# Patient Record
Sex: Female | Born: 1935
Health system: Southern US, Community
[De-identification: ages and names within clinical notes are randomized; demographics above are authoritative.]

## PROBLEM LIST (undated history)

## (undated) DIAGNOSIS — I471 Supraventricular tachycardia, unspecified: Secondary | ICD-10-CM

## (undated) DIAGNOSIS — M1990 Unspecified osteoarthritis, unspecified site: Secondary | ICD-10-CM

## (undated) DIAGNOSIS — Z87442 Personal history of urinary calculi: Secondary | ICD-10-CM

## (undated) DIAGNOSIS — R0600 Dyspnea, unspecified: Secondary | ICD-10-CM

## (undated) HISTORY — PX: TONSILLECTOMY: SUR1361

---

## 1999-12-10 ENCOUNTER — Other Ambulatory Visit: Admission: RE | Admit: 1999-12-10 | Discharge: 1999-12-10 | Payer: Self-pay | Admitting: Family Medicine

## 1999-12-18 ENCOUNTER — Encounter: Payer: Self-pay | Admitting: Family Medicine

## 1999-12-18 ENCOUNTER — Ambulatory Visit (HOSPITAL_COMMUNITY): Admission: RE | Admit: 1999-12-18 | Discharge: 1999-12-18 | Payer: Self-pay | Admitting: Family Medicine

## 2001-06-10 ENCOUNTER — Other Ambulatory Visit: Admission: RE | Admit: 2001-06-10 | Discharge: 2001-06-10 | Payer: Self-pay | Admitting: Family Medicine

## 2001-06-26 ENCOUNTER — Encounter: Payer: Self-pay | Admitting: Family Medicine

## 2001-06-26 ENCOUNTER — Encounter: Admission: RE | Admit: 2001-06-26 | Discharge: 2001-06-26 | Payer: Self-pay | Admitting: Family Medicine

## 2003-12-30 ENCOUNTER — Other Ambulatory Visit: Admission: RE | Admit: 2003-12-30 | Discharge: 2003-12-30 | Payer: Self-pay | Admitting: Family Medicine

## 2004-01-10 ENCOUNTER — Encounter: Admission: RE | Admit: 2004-01-10 | Discharge: 2004-01-10 | Payer: Self-pay | Admitting: Family Medicine

## 2007-02-10 ENCOUNTER — Encounter: Admission: RE | Admit: 2007-02-10 | Discharge: 2007-02-10 | Payer: Self-pay | Admitting: Family Medicine

## 2007-06-08 ENCOUNTER — Emergency Department (HOSPITAL_COMMUNITY): Admission: EM | Admit: 2007-06-08 | Discharge: 2007-06-08 | Payer: Self-pay | Admitting: Emergency Medicine

## 2010-09-04 NOTE — Consult Note (Signed)
NAMEMarland Kitchen  JOELEE, SNOKE NO.:  [id number]   MEDICAL RECORD NO.:  [id number]          PATIENT TYPE:  EMS   LOCATION:  ED                           FACILITY:  [facility]   PHYSICIAN:  Madelynn Done, MD  DATE OF BIRTH:  07/10/1931   DATE OF CONSULTATION:  06/08/2007  DATE OF DISCHARGE:  06/08/2007                                 CONSULTATION   REASON FOR CONSULTATION:  Right wrist injury and right knee injury.   HISTORY OF PRESENT ILLNESS:  Mrs. Nancy Conrad is a healthy-appearing, right-  hand dominant 77 year old female who fell getting out of a mobile home.  The patient sustained a fall onto her right knee and right wrist.  She  presented to the emergency department with pain and deformity of the  right wrist as well as pain and swelling over the right knee.  The  patient was consulted for the management of her wrist fracture and knee  injury.  The patient denies any complaints with the left upper  extremity.  He denies loss of consciousness, syncope and chest pain,  shortness of breath.   PAST MEDICAL HISTORY:  1. Osteoporosis.  2. Hypercholesterolemia.   PAST SURGICAL HISTORY:  No orthopedic surgeries.   ALLERGIES:  CODEINE.   MEDICATIONS:  Simvastatin and Fosamax.   SOCIAL HISTORY:  She lives with her husband.  She is a nonsmoker,  nondrinker.   REVIEW OF SYSTEMS:  No recent illnesses or hospitalizations.   PHYSICAL EXAMINATION:  GENERAL APPEARANCE:  She is a healthy-appearing  white female.  She is laying in bed comfortably.  She does not appear to  be in any acute distress.  VITAL SIGNS:  Temperature of 99.1, blood pressure 149/98, heart rate of  88, respirations 18.   On examination of the right upper extremity, she does not have any open  wounds or scars.  She has an obvious deformity to her right wrist.  She  is able to extend her thumb from a palm flat position.  She is able to  make the A-okay sign, cross her fingers, abduct and adduct her digits.  Her fingertips are warm and well-perfused.  She has a good radial pulse  with good sensation distally.  She does not have pain with flexion and  extension of her elbow but has limited wrist and forearm rotation.  Her  compartments are soft.   On examination of the right lower extremity.  She is able to dorsiflex  and plantar flex her toes and ankles.  She has an abrasion over the  anterior surface of the knee.  She is able to perform a straight leg  raise.  Her compartments are soft.   RADIOGRAPHY:  Two views of the right knee did show a nondisplaced  transverse fracture of the patella.   Two views of the wrist show the comminuted intra-articular fracture of  the right distal radius with an associated distal ulna fracture.  She  appears to have an old ulnar styloid fracture.   IMPRESSION:  1. Right intra-articular distal radius fracture and distal ulna  fracture.  2. Right nondisplaced patella fracture, right knee.  3. Osteoporosis.   PLAN:  Today, after the films were reviewed with the patient, we elected  to proceed with a closed manipulation of her right distal radius  fracture.  The patient underwent a hematoma block with 1% lidocaine 10  mL. Under fingertraps and 5 pounds weight, the patient underwent closed  manipulation.  The patient was then placed in a sugar-tong splint.  She  tolerated the procedure well.  Post reduction radiographs do show  improvement in alignment and she does have the dorsal comminution but  there is improvement in her radial height and length as well as a  neutral tilt on the lateral.   The patient is going to be discharged to home.  She is to weightbear as  tolerated in the knee immobilizer keeping it on at all times.  She is  going to keep her hand elevated.  Sling was given for when she is  mobile.  She is going to follow up in the office in one week.  Will  likely repeat her radiographs at that point in the splint if her splint  is in good  condition.  The patient was explained she may require  surgical intervention for her wrist depending on the displacement noted.  We talked about watching the knee closely but with the extensor  mechanism in continuity try to treat the patella flap fracture in a  closed manner.  All questions were addressed today.  Discussed plan with  the family.  She was going to be written for oral pain medication, ice,  elevation.  All questions were addressed prior to discharge.      Madelynn Done, MD  Electronically Signed     FWO/MEDQ  D:  06/08/2007  T:  06/09/2007  Job:  [id number]

## 2011-11-06 ENCOUNTER — Other Ambulatory Visit: Payer: Self-pay | Admitting: Orthopedic Surgery

## 2011-11-06 DIAGNOSIS — M79671 Pain in right foot: Secondary | ICD-10-CM

## 2011-11-07 ENCOUNTER — Ambulatory Visit
Admission: RE | Admit: 2011-11-07 | Discharge: 2011-11-07 | Disposition: A | Discharge status: Home / Self Care | Payer: Medicare Other | Source: Ambulatory Visit | Attending: Orthopedic Surgery | Admitting: Orthopedic Surgery

## 2011-11-07 DIAGNOSIS — M79671 Pain in right foot: Secondary | ICD-10-CM

## 2012-07-29 ENCOUNTER — Emergency Department (HOSPITAL_COMMUNITY)
Admission: EM | Admit: 2012-07-29 | Discharge: 2012-07-29 | Disposition: A | Discharge status: Home / Self Care | Payer: Medicare Other | Attending: Emergency Medicine | Admitting: Emergency Medicine

## 2012-07-29 ENCOUNTER — Encounter (HOSPITAL_COMMUNITY): Payer: Self-pay | Admitting: *Deleted

## 2012-07-29 ENCOUNTER — Emergency Department (HOSPITAL_COMMUNITY): Payer: Medicare Other

## 2012-07-29 DIAGNOSIS — Z8739 Personal history of other diseases of the musculoskeletal system and connective tissue: Secondary | ICD-10-CM | POA: Insufficient documentation

## 2012-07-29 DIAGNOSIS — I471 Supraventricular tachycardia: Secondary | ICD-10-CM

## 2012-07-29 DIAGNOSIS — R5381 Other malaise: Secondary | ICD-10-CM | POA: Insufficient documentation

## 2012-07-29 DIAGNOSIS — I498 Other specified cardiac arrhythmias: Secondary | ICD-10-CM | POA: Insufficient documentation

## 2012-07-29 DIAGNOSIS — R002 Palpitations: Secondary | ICD-10-CM | POA: Insufficient documentation

## 2012-07-29 HISTORY — DX: Unspecified osteoarthritis, unspecified site: M19.90

## 2012-07-29 LAB — CBC WITH DIFFERENTIAL/PLATELET
Basophils Absolute: 0 10*3/uL (ref 0.0–0.1)
Basophils Relative: 0 % (ref 0–1)
Eosinophils Absolute: 0 10*3/uL (ref 0.0–0.7)
Eosinophils Relative: 1 % (ref 0–5)
MCH: 31.1 pg (ref 26.0–34.0)
MCHC: 34.1 g/dL (ref 30.0–36.0)
MCV: 91.3 fL (ref 78.0–100.0)
Monocytes Absolute: 1.2 10*3/uL — ABNORMAL HIGH (ref 0.1–1.0)
Platelets: 177 10*3/uL (ref 150–400)
RDW: 13.3 % (ref 11.5–15.5)
WBC: 8.5 10*3/uL (ref 4.0–10.5)

## 2012-07-29 LAB — POCT I-STAT, CHEM 8
Calcium, Ion: 1.15 mmol/L (ref 1.13–1.30)
Hemoglobin: 13.9 g/dL (ref 12.0–15.0)
Sodium: 139 mEq/L (ref 135–145)
TCO2: 27 mmol/L (ref 0–100)

## 2012-07-29 LAB — POCT I-STAT TROPONIN I: Troponin i, poc: 0.12 ng/mL (ref 0.00–0.08)

## 2012-07-29 MED ORDER — ASPIRIN 81 MG PO CHEW
324.0000 mg | CHEWABLE_TABLET | Freq: Once | ORAL | Status: AC
  Administered 2012-07-29: 324 mg via ORAL
  Filled 2012-07-29: qty 4

## 2012-07-29 MED ORDER — METOPROLOL TARTRATE 50 MG PO TABS: 25.0000 mg | ORAL_TABLET | Freq: Two times a day (BID) | ORAL | Status: DC

## 2012-07-29 NOTE — ED Notes (Signed)
Admit date: 07/29/2012 Referring Physician    Primary Cardiologist  Eldridge Dace Reason for Consultation  supraventricular tachycardia  HPI: 79 year old woman who has had palpitations intermittently for many years.  Her longest episode in the past would last up to a couple of hours.  They always occurred at night.  They would wake her from sleep.  She never had lightheadedness or syncope.  She would just continue to lie down and the palpitations would eventually resolve.  She has tried coughing and Valsalva maneuvers with some success.  Early this morning, she began having her typical symptoms.  They lasted 10 hours prior to her calling EMS.  This was the longest episode that she has had.  She was brought to the emergency room and given IV been seen.  Her heart rhythm converted to normal sinus rhythm.  She currently feels well.  She denies any chest pain or shortness of breath.  She does walk frequently and has not had any symptoms of chest discomfort or shortness of breath.  She has never had any type of significant cardiac evaluation in the past.     PMH:   Past Medical History  Diagnosis Date  . Arthritis      PSH:  No past surgical history on file.  Allergies:  Review of patient's allergies indicates no known allergies. Prior to Admit Meds:   (Not in a hospital admission) Fam HX:   No family history on file. Social HX:    History   Social History  . Marital Status: Married    Spouse Name: N/A    Number of Children: N/A  . Years of Education: N/A   Occupational History  . Not on file.   Social History Main Topics  . Smoking status: Never Smoker   . Smokeless tobacco: Not on file  . Alcohol Use: Not on file  . Drug Use: No  . Sexually Active: Not on file   Other Topics Concern  . Not on file   Social History Narrative  . No narrative on file     ROS:  All 11 ROS were addressed and are negative except what is stated in the HPI  Physical Exam: Blood pressure 107/68, pulse  77, temperature 98.3 F (36.8 C), temperature source Oral, resp. rate 19, SpO2 97.00%.  General: Well developed, well nourished, in no acute distress Head:    Normal cephalic and atramatic  Lungs:  Clear bilaterally to auscultation and percussion. Heart:   HRRR S1 S2             No carotid bruit. No JVD.   Abdomen:  abdomen soft and non-tender Msk:   Normal strength and tone for age. Extremities:   No clubbing, cyanosis or edema.  DP +1 Neuro: Alert and oriented X 3. Psych:  Normal affect, responds appropriately    Labs:   Lab Results  Component Value Date   WBC 8.5 07/29/2012   HGB 13.9 07/29/2012   HCT 41.0 07/29/2012   MCV 91.3 07/29/2012   PLT 177 07/29/2012    Recent Labs Lab 07/29/12 1157  NA 139  K 4.8  CL 104  BUN 18  CREATININE 0.80  GLUCOSE 112*   No results found for this basename: PTT   No results found for this basename: INR, PROTIME   No results found for this basename: CKTOTAL, CKMB, CKMBINDEX, TROPONINI     No results found for this basename: CHOL   No results found for this basename: HDL  No results found for this basename: LDLCALC   No results found for this basename: TRIG   No results found for this basename: CHOLHDL   No results found for this basename: LDLDIRECT      Radiology:  Dg Chest 2 View  07/29/2012  *RADIOLOGY REPORT*  Clinical Data: Difficulty breathing.  CHEST - 2 VIEW  Comparison: Thoracic spine series 09/18/2010 and chest radiograph 07/28/2006.  Findings: Trachea is midline.  Heart size normal.  Descending thoracic aorta is tortuous.  Curvilinear soft tissue in the right paratracheal region appears new from prior examinations.  Lungs are mildly hyperinflated. Probable pleural parenchymal scarring at the left costophrenic angle.  Multiple thoracic spine compression fractures appear grossly unchanged.  IMPRESSION:  Curvilinear soft tissue density in the low right paratracheal region appears new from prior examinations. Non emergent CT chest  with contrast could be performed in further evaluation, as clinically indicated.   Original Report Authenticated By: Leanna Battles, M.D.     EKG:  Likely sinus rhythm.  Unusual P-wave axis.  No significant ST segment changes.  On telemetry, she has evidence of PACs.  ASSESSMENT: SVT  PLAN:  We'll plan for outpatient echocardiogram.  Minimally elevated troponin likely due to prolonged fast heart rate.  She has no anginal symptoms.  She has been hemodynamically stable.  We'll consider outpatient stress testing.  Start metoprolol 25 mg by mouth twice a day to help suppress the SVT.  Our office will contact her for followup.  Of note, she has been under a lot of stress recently with her husband's illness.  He is currently admitted to the hospital.  Corky Crafts., MD  07/29/2012  3:32 PM

## 2012-07-29 NOTE — ED Provider Notes (Signed)
History     CSN: [id number]  Arrival date & time 07/29/12  1038   First MD Initiated Contact with Patient 07/29/12 1040      Chief Complaint  Patient presents with  . Tachycardia    (Consider location/radiation/quality/duration/timing/severity/associated sxs/prior treatment) HPI Comments: 79 year old female with no significant past medical history who presents from home with a complaint of palpitations. The patient states that she had onset of palpitations approximately midnight, 10 hours ago, this was persistent throughout the evening and associated with diaphoresis, generalized fatigue and weakness. She has had these episodes in the past the last couple of hours and then resolves spontaneously but she has never sought medical attention. This episode lasted greater than 10 hours prompting her call to 911, she was given 6 mg of adenosine by paramedics which successfully converted the rhythm, interventricular tachycardia at the rate of 210 beats per minute to a normal sinus rhythm. The patient states that at this time she has no complaints other than feeling fatigued however she has been sleeping poorly and has been in the hospital with her husband who has been here for the last 2 days. She has never seen a cardiologist, has never been treated for hypertension diabetes and is a nonsmoker. She denies fevers chills nausea vomiting coughing swelling rashes diarrhea dysuria or any other complaints  The history is provided by the patient and the EMS personnel.    Past Medical History  Diagnosis Date  . Arthritis     No past surgical history on file.  No family history on file.  History  Substance Use Topics  . Smoking status: Never Smoker   . Smokeless tobacco: Not on file  . Alcohol Use: Not on file    OB History   Grav Para Term Preterm Abortions TAB SAB Ect Mult Living                  Review of Systems  All other systems reviewed and are negative.    Allergies  Review of  patient's allergies indicates no known allergies.  Home Medications   Current Outpatient Rx  Name  Route  Sig  Dispense  Refill  . acetaminophen (TYLENOL) 500 MG tablet   Oral   Take 500-1,000 mg by mouth every 6 (six) hours as needed for pain.         . calcitonin, salmon, (MIACALCIN/FORTICAL) 200 UNIT/ACT nasal spray   Nasal   Place 1 spray into the nose daily.         . metoprolol (LOPRESSOR) 50 MG tablet   Oral   Take 0.5 tablets (25 mg total) by mouth 2 (two) times daily.   60 tablet   1     BP 106/64  Pulse 77  Temp(Src) 98.4 F (36.9 C) (Oral)  Resp 17  SpO2 97%  Physical Exam  Nursing note and vitals reviewed. Constitutional: She appears well-developed and well-nourished. No distress.  HENT:  Head: Normocephalic and atraumatic.  Mouth/Throat: Oropharynx is clear and moist. No oropharyngeal exudate.  Eyes: Conjunctivae and EOM are normal. Pupils are equal, round, and reactive to light. Right eye exhibits no discharge. Left eye exhibits no discharge. No scleral icterus.  Neck: Normal range of motion. Neck supple. No JVD present. No thyromegaly present.  Cardiovascular: Normal rate, regular rhythm, normal heart sounds and intact distal pulses.  Exam reveals no gallop and no friction rub.   No murmur heard. Pulmonary/Chest: Effort normal and breath sounds normal. No respiratory distress. She  has no wheezes. She has no rales.  Abdominal: Soft. Bowel sounds are normal. She exhibits no distension and no mass. There is no tenderness.  Musculoskeletal: Normal range of motion. She exhibits no edema and no tenderness.  Lymphadenopathy:    She has no cervical adenopathy.  Neurological: She is alert. Coordination normal.  Skin: Skin is warm and dry. No rash noted. No erythema.  Psychiatric: She has a normal mood and affect. Her behavior is normal.    ED Course  Procedures (including critical care time)  Labs Reviewed  CBC WITH DIFFERENTIAL - Abnormal; Notable for  the following:    Neutrophils Relative 78 (*)    Lymphocytes Relative 7 (*)    Lymphs Abs 0.6 (*)    Monocytes Relative 14 (*)    Monocytes Absolute 1.2 (*)    All other components within normal limits  POCT I-STAT, CHEM 8 - Abnormal; Notable for the following:    Glucose, Bld 112 (*)    All other components within normal limits  POCT I-STAT TROPONIN I - Abnormal; Notable for the following:    Troponin i, poc 0.12 (*)    All other components within normal limits   Dg Chest 2 View  07/29/2012  *RADIOLOGY REPORT*  Clinical Data: Difficulty breathing.  CHEST - 2 VIEW  Comparison: Thoracic spine series 09/18/2010 and chest radiograph 07/28/2006.  Findings: Trachea is midline.  Heart size normal.  Descending thoracic aorta is tortuous.  Curvilinear soft tissue in the right paratracheal region appears new from prior examinations.  Lungs are mildly hyperinflated. Probable pleural parenchymal scarring at the left costophrenic angle.  Multiple thoracic spine compression fractures appear grossly unchanged.  IMPRESSION:  Curvilinear soft tissue density in the low right paratracheal region appears new from prior examinations. Non emergent CT chest with contrast could be performed in further evaluation, as clinically indicated.   Original Report Authenticated By: Leanna Battles, M.D.      1. SVT (supraventricular tachycardia)       MDM  At this time the patient appears benign, has a normal exam including heart sounds lung sounds abdominal exam without any peripheral edema or JVD. I suspect that she's been having intermittent episodes of SVT though this could have also been paroxysmal atrial fibrillation or ventricular arrhythmias in the past. Will discuss with cardiology after workup completed. Cardiac monitor, EKG, labs, chest x-ray  ED ECG REPORT  I personally interpreted this EKG   Date: 07/29/2012   Rate: 84  Rhythm: normal sinus rhythm  QRS Axis: normal  Intervals: normal  ST/T Wave  abnormalities: normal  Conduction Disutrbances:none  Narrative Interpretation:   Old EKG Reviewed: none available  Requested cardiology consultation in the emergency department, the patient does have a low elevated troponin though this is borderline and likely related to the amount of time that she was in SVT. Aspirin has been given, patient remained stable  D/w Dr. Eldridge Dace will se pt.  Likely Metoprolol 25mg  Po BID and f/u for echo, possible cath per Cardiologist.  Pt has been stable since arrival, no recurrent arrhythmias.  Cardiology has seen patient, agrees with discharge and followup for echocardiogram, patient informed, stable for discharge       Vida Roller, MD 07/29/12 [phone]

## 2012-07-29 NOTE — ED Notes (Signed)
Pt in via [facility] from home, pt c/o rapid heart rate & dizziness onset last night, pt c/o worsening today @ 10:00, pt received 6 mg Adenisone in route with initial HR of 210, pt converted to NSR with HR in mid 80s, pt A&O x4, follows commands, speaks in complete sentences, pt denies CP, SOB, pt denies current N/V/D

## 2013-02-11 ENCOUNTER — Ambulatory Visit (INDEPENDENT_AMBULATORY_CARE_PROVIDER_SITE_OTHER): Payer: Medicare Other | Admitting: Interventional Cardiology

## 2013-02-11 ENCOUNTER — Encounter: Payer: Self-pay | Admitting: Interventional Cardiology

## 2013-02-11 VITALS — BP 122/90 | HR 73 | Ht 64.0 in | Wt 143.8 lb

## 2013-02-11 DIAGNOSIS — I498 Other specified cardiac arrhythmias: Secondary | ICD-10-CM

## 2013-02-11 DIAGNOSIS — I471 Supraventricular tachycardia: Secondary | ICD-10-CM

## 2013-02-11 NOTE — Patient Instructions (Signed)
Your physician wants you to follow-up in: 1 year with Dr. Varanasi. You will receive a reminder letter in the mail two months in advance. If you don't receive a letter, please call our office to schedule the follow-up appointment.  Your physician recommends that you continue on your current medications as directed. Please refer to the Current Medication list given to you today.  

## 2013-02-11 NOTE — Progress Notes (Signed)
Patient ID: Nancy Conrad, female   DOB: [birth year], 79 y.o.   MRN: [id number]    [address] [place], [place]  [address] Phone: [phone] Fax:  [phone]  Date:  02/11/2013   ID:  Nancy Conrad, DOB 1931/05/12, MRN [id number]  PCP:  Nancy Aschoff, MD      History of Present Illness: Nancy Conrad is a 79 y.o. female who had SVT lasting about 10 hours in early 2014. She went to the emergency room. She received adenosine and she converted to normal sinus rhythm. She was started on a beta blocker. She has not had any further episodes since then. Her husband was ill and he passed away a few months ago. She seems to be getting used to things. She continues to exercise on a regular basis including walking and swimming. Overall, she has felt well.    Wt Readings from Last 3 Encounters:  02/11/13 143 lb 12.8 oz (65.227 kg)     Past Medical History  Diagnosis Date  . Arthritis     Current Outpatient Prescriptions  Medication Sig Dispense Refill  . acetaminophen (TYLENOL) 500 MG tablet Take 500-1,000 mg by mouth every 6 (six) hours as needed for pain.      . B Complex Vitamins (VITAMIN B COMPLEX PO) Take 100 mg by mouth daily.      . calcitonin, salmon, (MIACALCIN/FORTICAL) 200 UNIT/ACT nasal spray Place 1 spray into the nose daily.      . Calcium 200 MG TABS Take 1 tablet by mouth.      . cholecalciferol (VITAMIN D) 1000 UNITS tablet Take 2,000 Units by mouth daily.      . metoprolol (LOPRESSOR) 50 MG tablet Take 0.5 tablets (25 mg total) by mouth 2 (two) times daily.  60 tablet  1   No current facility-administered medications for this visit.    Allergies:   No Known Allergies  Social History:  The patient  reports that she has never smoked. She does not have any smokeless tobacco history on file. She reports that she does not use illicit drugs.   Family History:  The patient's family history is not on file.   ROS:  Please see the history of present illness.   No nausea, vomiting.  No fevers, chills.  No focal weakness.  No dysuria.   All other systems reviewed and negative.   PHYSICAL EXAM: VS:  BP 122/90  Pulse 73  Ht 5\' 4"  (1.626 m)  Wt 143 lb 12.8 oz (65.227 kg)  BMI 24.67 kg/m2  SpO2 97% Well nourished, well developed, in no acute distress HEENT: normal Neck: no JVD, no carotid bruits Cardiac:  normal S1, S2; RRR;  Lungs:  clear to auscultation bilaterally, no wheezing, rhonchi or rales Abd: soft, nontender, no hepatomegaly Ext: no edema Skin: warm and dry Neuro:   no focal abnormalities noted  EKG:      ASSESSMENT AND PLAN:  SVT:   SVT/PAT  Continue Metoprolol Tartrate Tablet, 25 MG, 1 tablet, Orally, Twice a day LAB: T4 and TSH (132440) Notes: Echo showed normal left ventricular function. Check thyroid function tests. SVT sx under control. If she fails medical therapy, would have to consider referral to EP and ablation.    Procedures  Prior T4 was normal.        Labs    Lab: T4 and TSH (102725)  Thyroxine (T4) 8.9  4.5-12.0 - UG/DL   Nancy Conrad,JAY 36/64/4034 03:35:09 PM > normal  T4      Signed, Fredric Mare, MD, [facility] 02/11/2013 11:29 AM

## 2013-07-09 ENCOUNTER — Other Ambulatory Visit: Payer: Self-pay | Admitting: Interventional Cardiology

## 2013-07-22 ENCOUNTER — Encounter: Payer: Self-pay | Admitting: Interventional Cardiology

## 2017-01-13 ENCOUNTER — Ambulatory Visit
Admission: RE | Admit: 2017-01-13 | Discharge: 2017-01-13 | Disposition: A | Discharge status: Home / Self Care | Payer: Medicare Other | Source: Ambulatory Visit | Attending: Family Medicine | Admitting: Family Medicine

## 2017-01-13 ENCOUNTER — Other Ambulatory Visit: Payer: Self-pay | Admitting: Family Medicine

## 2017-01-13 DIAGNOSIS — R52 Pain, unspecified: Secondary | ICD-10-CM

## 2019-08-30 ENCOUNTER — Other Ambulatory Visit: Payer: Self-pay | Admitting: Family Medicine

## 2019-08-30 DIAGNOSIS — M810 Age-related osteoporosis without current pathological fracture: Secondary | ICD-10-CM

## 2019-11-10 ENCOUNTER — Ambulatory Visit
Admission: RE | Admit: 2019-11-10 | Discharge: 2019-11-10 | Disposition: A | Discharge status: Home / Self Care | Payer: Medicare Other | Source: Ambulatory Visit | Attending: Family Medicine | Admitting: Family Medicine

## 2019-11-10 ENCOUNTER — Other Ambulatory Visit: Payer: Self-pay

## 2019-11-10 DIAGNOSIS — M810 Age-related osteoporosis without current pathological fracture: Secondary | ICD-10-CM

## 2019-11-10 DIAGNOSIS — M85852 Other specified disorders of bone density and structure, left thigh: Secondary | ICD-10-CM | POA: Diagnosis not present

## 2019-11-12 DIAGNOSIS — M1612 Unilateral primary osteoarthritis, left hip: Secondary | ICD-10-CM | POA: Diagnosis not present

## 2019-12-13 DIAGNOSIS — E559 Vitamin D deficiency, unspecified: Secondary | ICD-10-CM | POA: Diagnosis not present

## 2019-12-13 DIAGNOSIS — M169 Osteoarthritis of hip, unspecified: Secondary | ICD-10-CM | POA: Diagnosis not present

## 2019-12-13 DIAGNOSIS — M810 Age-related osteoporosis without current pathological fracture: Secondary | ICD-10-CM | POA: Diagnosis not present

## 2019-12-16 ENCOUNTER — Encounter (HOSPITAL_COMMUNITY): Admission: RE | Admit: 2019-12-16 | Payer: Medicare PPO | Source: Ambulatory Visit

## 2019-12-22 ENCOUNTER — Ambulatory Visit (HOSPITAL_COMMUNITY): Admission: RE | Admit: 2019-12-22 | Payer: Medicare PPO | Source: Home / Self Care | Admitting: Orthopedic Surgery

## 2019-12-22 ENCOUNTER — Encounter (HOSPITAL_COMMUNITY): Admission: RE | Payer: Self-pay | Source: Home / Self Care

## 2019-12-22 DIAGNOSIS — M1612 Unilateral primary osteoarthritis, left hip: Secondary | ICD-10-CM | POA: Diagnosis not present

## 2019-12-22 SURGERY — ARTHROPLASTY, HIP, TOTAL, ANTERIOR APPROACH
Anesthesia: Choice | Site: Hip | Laterality: Left

## 2020-03-06 NOTE — Progress Notes (Addendum)
COVID Vaccine Completed: x3 Date COVID Vaccine completed:  04-26-19& 05-24-19 02-29-20 Booster COVID vaccine manufacturer: Pfizer    Moderna   [employer]   PCP - C. Duane Lope, MD Cardiologist - Everette Rank, MD.  Last OV 02-11-13  Chest x-ray -  EKG - 03-09-20 in Epic Stress Test -  ECHO - 08-06-2012 in Epic Cardiac Cath -  Pacemaker/ICD device last checked:  Sleep Study -  CPAP -   Fasting Blood Sugar -  Checks Blood Sugar _____ times a day  Blood Thinner Instructions: Aspirin Instructions: Last Dose:  Anesthesia review: SVT on Metoprolol  Patient has shortness of breath with exertion.  No fever, cough and chest pain at PAT appointment.  Able to slowly climb a flight of stairs.  Able to perform ADLs without assistance.   Patient verbalized understanding of instructions that were given to them at the PAT appointment. Patient was also instructed that they will need to review over the PAT instructions again at home before surgery.

## 2020-03-06 NOTE — Patient Instructions (Addendum)
DUE TO COVID-19 ONLY ONE VISITOR IS ALLOWED TO COME WITH YOU AND STAY IN THE WAITING ROOM ONLY DURING PRE OP AND PROCEDURE.   IF YOU WILL BE ADMITTED INTO THE HOSPITAL YOU ARE ALLOWED ONE SUPPORT PERSON DURING VISITATION HOURS ONLY (10AM -8PM)   . The support person may change daily. . The support person must pass our screening, gel in and out, and wear a mask at all times, including in the patient's room. . Patients must also wear a mask when staff or their support person are in the room.   COVID SWAB TESTING MUST BE COMPLETED ON:   Saturday, 03-11-20 @ 10:05 AM   [address] [place], [place] [address]  (Must self quarantine after testing. Follow instructions on handout.)        Your procedure is scheduled on:  Wednesday, 03-15-20   Report to [facility] Main  Entrance   Report to Short Stay at 5:30 AM   [facility])    Call this number if you have problems the morning of surgery [phone]   Do not eat food :After Midnight.   May have liquids until 4:30 AM  day of surgery  CLEAR LIQUID DIET  Foods Allowed                                                                     Foods Excluded  Water, Black Coffee and tea, regular and decaf              liquids that you cannot  Plain Jell-O in any flavor  (No red)                                     see through such as: Fruit ices (not with fruit pulp)                                      milk, soups, orange juice              Iced Popsicles (No red)                                      All solid food                                   Apple juices Sports drinks like Gatorade (No red) Lightly seasoned clear broth or consume(fat free) Sugar, honey syrup    Complete one Ensure drink the morning of surgery at  4:30 AM the day of surgery.    Oral Hygiene is also important to reduce your risk of infection.                                    Remember - BRUSH YOUR TEETH THE MORNING OF SURGERY WITH YOUR REGULAR  TOOTHPASTE   Do NOT smoke after  Midnight   Take these medicines the morning of surgery with A SIP OF WATER:  Metoprolol                               You may not have any metal on your body including hair pins, jewelry, and body piercings             Do not wear make-up, lotions, powders, perfumes/cologne, or deodorant             Do not wear nail polish.  Do not shave  48 hours prior to surgery.    Do not bring valuables to the hospital. [facility] IS NOT RESPONSIBLE   FOR VALUABLES.   Contacts, dentures or bridgework may not be worn into surgery.   Bring small overnight bag day of surgery.    Special Instructions: Bring a copy of your healthcare power of attorney and living will documents         the day of surgery if you haven't scanned them in before.              Please read over the following fact sheets you were given: IF YOU HAVE QUESTIONS ABOUT YOUR PRE OP INSTRUCTIONS PLEASE CALL [phone]   [place] - Preparing for Surgery Before surgery, you can play an important role.  Because skin is not sterile, your skin needs to be as free of germs as possible.  You can reduce the number of germs on your skin by washing with CHG (chlorahexidine gluconate) soap before surgery.  CHG is an antiseptic cleaner which kills germs and bonds with the skin to continue killing germs even after washing. Please DO NOT use if you have an allergy to CHG or antibacterial soaps.  If your skin becomes reddened/irritated stop using the CHG and inform your nurse when you arrive at Short Stay. Do not shave (including legs and underarms) for at least 48 hours prior to the first CHG shower.  You may shave your face/neck.  Please follow these instructions carefully:  1.  Shower with CHG Soap the night before surgery and the  morning of surgery.  2.  If you choose to wash your hair, wash your hair first as usual with your normal  shampoo.  3.  After you shampoo, rinse your hair and body thoroughly to  remove the shampoo.                             4.  Use CHG as you would any other liquid soap.  You can apply chg directly to the skin and wash.  Gently with a scrungie or clean washcloth.  5.  Apply the CHG Soap to your body ONLY FROM THE NECK DOWN.   Do   not use on face/ open                           Wound or open sores. Avoid contact with eyes, ears mouth and   genitals (private parts).                       Wash face,  Genitals (private parts) with your normal soap.             6.  Wash thoroughly, paying special attention to the area where your  surgery  will be performed.  7.  Thoroughly rinse your body with warm water from the neck down.  8.  DO NOT shower/wash with your normal soap after using and rinsing off the CHG Soap.                9.  Pat yourself dry with a clean towel.            10.  Wear clean pajamas.            11.  Place clean sheets on your bed the night of your first shower and do not  sleep with pets. Day of Surgery : Do not apply any lotions/deodorants the morning of surgery.  Please wear clean clothes to the hospital/surgery center.  FAILURE TO FOLLOW THESE INSTRUCTIONS MAY RESULT IN THE CANCELLATION OF YOUR SURGERY  PATIENT SIGNATURE_________________________________  NURSE SIGNATURE__________________________________  ________________________________________________________________________   Adam Phenix  An incentive spirometer is a tool that can help keep your lungs clear and active. This tool measures how well you are filling your lungs with each breath. Taking long deep breaths may help reverse or decrease the chance of developing breathing (pulmonary) problems (especially infection) following:  A long period of time when you are unable to move or be active. BEFORE THE PROCEDURE   If the spirometer includes an indicator to show your best effort, your nurse or respiratory therapist will set it to a desired goal.  If possible, sit up straight  or lean slightly forward. Try not to slouch.  Hold the incentive spirometer in an upright position. INSTRUCTIONS FOR USE  1. Sit on the edge of your bed if possible, or sit up as far as you can in bed or on a chair. 2. Hold the incentive spirometer in an upright position. 3. Breathe out normally. 4. Place the mouthpiece in your mouth and seal your lips tightly around it. 5. Breathe in slowly and as deeply as possible, raising the piston or the ball toward the top of the column. 6. Hold your breath for 3-5 seconds or for as long as possible. Allow the piston or ball to fall to the bottom of the column. 7. Remove the mouthpiece from your mouth and breathe out normally. 8. Rest for a few seconds and repeat Steps 1 through 7 at least 10 times every 1-2 hours when you are awake. Take your time and take a few normal breaths between deep breaths. 9. The spirometer may include an indicator to show your best effort. Use the indicator as a goal to work toward during each repetition. 10. After each set of 10 deep breaths, practice coughing to be sure your lungs are clear. If you have an incision (the cut made at the time of surgery), support your incision when coughing by placing a pillow or rolled up towels firmly against it. Once you are able to get out of bed, walk around indoors and cough well. You may stop using the incentive spirometer when instructed by your caregiver.  RISKS AND COMPLICATIONS  Take your time so you do not get dizzy or light-headed.  If you are in pain, you may need to take or ask for pain medication before doing incentive spirometry. It is harder to take a deep breath if you are having pain. AFTER USE  Rest and breathe slowly and easily.  It can be helpful to keep track of a log of your progress. Your caregiver can provide you with a simple table to help  with this. If you are using the spirometer at home, follow these instructions: [place] IF:   You are having  difficultly using the spirometer.  You have trouble using the spirometer as often as instructed.  Your pain medication is not giving enough relief while using the spirometer.  You develop fever of 100.5 F (38.1 C) or higher. SEEK IMMEDIATE MEDICAL CARE IF:   You cough up bloody sputum that had not been present before.  You develop fever of 102 F (38.9 C) or greater.  You develop worsening pain at or near the incision site. MAKE SURE YOU:   Understand these instructions.  Will watch your condition.  Will get help right away if you are not doing well or get worse. Document Released: 08/19/2006 Document Revised: 07/01/2011 Document Reviewed: 10/20/2006 Exit[employer] Patient Information 2014 Exit[employer], [place].   ________________________________________________________________________  WHAT IS A BLOOD TRANSFUSION? Blood Transfusion Information  A transfusion is the replacement of blood or some of its parts. Blood is made up of multiple cells which provide different functions.  Red blood cells carry oxygen and are used for blood loss replacement.  White blood cells fight against infection.  Platelets control bleeding.  Plasma helps clot blood.  Other blood products are available for specialized needs, such as hemophilia or other clotting disorders. BEFORE THE TRANSFUSION  Who gives blood for transfusions?   Healthy volunteers who are fully evaluated to make sure their blood is safe. This is blood bank blood. Transfusion therapy is the safest it has ever been in the practice of medicine. Before blood is taken from a donor, a complete history is taken to make sure that person has no history of diseases nor engages in risky social behavior (examples are intravenous drug use or sexual activity with multiple partners). The donor's travel history is screened to minimize risk of transmitting infections, such as malaria. The donated blood is tested for signs of infectious diseases, such as  HIV and hepatitis. The blood is then tested to be sure it is compatible with you in order to minimize the chance of a transfusion reaction. If you or a relative donates blood, this is often done in anticipation of surgery and is not appropriate for emergency situations. It takes many days to process the donated blood. RISKS AND COMPLICATIONS Although transfusion therapy is very safe and saves many lives, the main dangers of transfusion include:   Getting an infectious disease.  Developing a transfusion reaction. This is an allergic reaction to something in the blood you were given. Every precaution is taken to prevent this. The decision to have a blood transfusion has been considered carefully by your caregiver before blood is given. Blood is not given unless the benefits outweigh the risks. AFTER THE TRANSFUSION  Right after receiving a blood transfusion, you will usually feel much better and more energetic. This is especially true if your red blood cells have gotten low (anemic). The transfusion raises the level of the red blood cells which carry oxygen, and this usually causes an energy increase.  The nurse administering the transfusion will monitor you carefully for complications. HOME CARE INSTRUCTIONS  No special instructions are needed after a transfusion. You may find your energy is better. Speak with your caregiver about any limitations on activity for underlying diseases you may have. SEEK MEDICAL CARE IF:   Your condition is not improving after your transfusion.  You develop redness or irritation at the intravenous (IV) site. SEEK IMMEDIATE MEDICAL CARE IF:  Any of the following symptoms occur over the next 12 hours:  Shaking chills.  You have a temperature by mouth above 102 F (38.9 C), not controlled by medicine.  Chest, back, or muscle pain.  People around you feel you are not acting correctly or are confused.  Shortness of breath or difficulty breathing.  Dizziness  and fainting.  You get a rash or develop hives.  You have a decrease in urine output.  Your urine turns a dark color or changes to pink, red, or brown. Any of the following symptoms occur over the next 10 days:  You have a temperature by mouth above 102 F (38.9 C), not controlled by medicine.  Shortness of breath.  Weakness after normal activity.  The white part of the eye turns yellow (jaundice).  You have a decrease in the amount of urine or are urinating less often.  Your urine turns a dark color or changes to pink, red, or brown. Document Released: 04/05/2000 Document Revised: 07/01/2011 Document Reviewed: 11/23/2007 [facility] Patient Information 2014 [place], [place].  _______________________________________________________________________

## 2020-03-09 ENCOUNTER — Other Ambulatory Visit: Payer: Self-pay

## 2020-03-09 ENCOUNTER — Encounter (HOSPITAL_COMMUNITY)
Admission: RE | Admit: 2020-03-09 | Discharge: 2020-03-09 | Disposition: A | Discharge status: Home / Self Care | Payer: Medicare PPO | Source: Ambulatory Visit | Attending: Orthopedic Surgery | Admitting: Orthopedic Surgery

## 2020-03-09 ENCOUNTER — Encounter (HOSPITAL_COMMUNITY): Payer: Self-pay

## 2020-03-09 DIAGNOSIS — Z01818 Encounter for other preprocedural examination: Secondary | ICD-10-CM | POA: Insufficient documentation

## 2020-03-09 HISTORY — DX: Supraventricular tachycardia: I47.1

## 2020-03-09 HISTORY — DX: Personal history of urinary calculi: Z87.442

## 2020-03-09 HISTORY — DX: Supraventricular tachycardia, unspecified: I47.10

## 2020-03-09 HISTORY — DX: Dyspnea, unspecified: R06.00

## 2020-03-09 LAB — CBC
HCT: 40.3 % (ref 36.0–46.0)
Hemoglobin: 12.7 g/dL (ref 12.0–15.0)
MCH: 31 pg (ref 26.0–34.0)
MCHC: 31.5 g/dL (ref 30.0–36.0)
MCV: 98.3 fL (ref 80.0–100.0)
Platelets: 241 10*3/uL (ref 150–400)
RBC: 4.1 MIL/uL (ref 3.87–5.11)
RDW: 13 % (ref 11.5–15.5)
WBC: 5.7 10*3/uL (ref 4.0–10.5)
nRBC: 0 % (ref 0.0–0.2)

## 2020-03-09 LAB — COMPREHENSIVE METABOLIC PANEL
ALT: 14 U/L (ref 0–44)
AST: 19 U/L (ref 15–41)
Albumin: 4.5 g/dL (ref 3.5–5.0)
Alkaline Phosphatase: 60 U/L (ref 38–126)
Anion gap: 12 (ref 5–15)
BUN: 20 mg/dL (ref 8–23)
CO2: 27 mmol/L (ref 22–32)
Calcium: 9.9 mg/dL (ref 8.9–10.3)
Chloride: 98 mmol/L (ref 98–111)
Creatinine, Ser: 0.64 mg/dL (ref 0.44–1.00)
GFR, Estimated: >60 mL/min (ref >60)
Glucose, Bld: 93 mg/dL (ref 70–99)
Potassium: 5 mmol/L (ref 3.5–5.1)
Sodium: 137 mmol/L (ref 135–145)
Total Bilirubin: 0.6 mg/dL (ref 0.3–1.2)
Total Protein: 7.5 g/dL (ref 6.5–8.1)

## 2020-03-09 LAB — PROTIME-INR
INR: 1 (ref 0.8–1.2)
Prothrombin Time: 12.8 seconds (ref 11.4–15.2)

## 2020-03-09 LAB — SURGICAL PCR SCREEN
MRSA, PCR: NEGATIVE
Staphylococcus aureus: POSITIVE — AB

## 2020-03-09 LAB — APTT: aPTT: 27 seconds (ref 24–36)

## 2020-03-09 NOTE — Progress Notes (Signed)
PCR sent to Dr. Aluisio to review. 

## 2020-03-11 ENCOUNTER — Other Ambulatory Visit (HOSPITAL_COMMUNITY)
Admission: RE | Admit: 2020-03-11 | Discharge: 2020-03-11 | Disposition: A | Discharge status: Home / Self Care | Payer: Medicare PPO | Source: Ambulatory Visit | Attending: Orthopedic Surgery | Admitting: Orthopedic Surgery

## 2020-03-11 DIAGNOSIS — Z01812 Encounter for preprocedural laboratory examination: Secondary | ICD-10-CM | POA: Diagnosis not present

## 2020-03-11 DIAGNOSIS — Z20822 Contact with and (suspected) exposure to covid-19: Secondary | ICD-10-CM | POA: Insufficient documentation

## 2020-03-11 LAB — SARS CORONAVIRUS 2 (TAT 6-24 HRS): SARS Coronavirus 2: NEGATIVE

## 2020-03-14 ENCOUNTER — Encounter (HOSPITAL_COMMUNITY): Payer: Self-pay | Admitting: Orthopedic Surgery

## 2020-03-14 NOTE — Anesthesia Preprocedure Evaluation (Addendum)
Anesthesia Evaluation  Patient identified by MRN, date of birth, ID band Patient awake    Reviewed: Allergy & Precautions, H&P , NPO status , Patient's Chart, lab work & pertinent test results  Airway Mallampati: I  TM Distance: >3 FB Neck ROM: Full    Dental no notable dental hx. (+) Poor Dentition, Missing,    Pulmonary neg pulmonary ROS,    Pulmonary exam normal breath sounds clear to auscultation       Cardiovascular Exercise Tolerance: Good Pt. on home beta blockers negative cardio ROS Normal cardiovascular exam+ dysrhythmias Supra Ventricular Tachycardia  Rhythm:Regular Rate:Normal     Neuro/Psych negative neurological ROS  negative psych ROS   GI/Hepatic negative GI ROS, Neg liver ROS,   Endo/Other  negative endocrine ROS  Renal/GU negative Renal ROS  negative genitourinary   Musculoskeletal negative musculoskeletal ROS (+) Arthritis , Osteoarthritis,    Abdominal   Peds negative pediatric ROS (+)  Hematology negative hematology ROS (+)   Anesthesia Other Findings   Reproductive/Obstetrics negative OB ROS                            Anesthesia Physical Anesthesia Plan  ASA: III  Anesthesia Plan: General   Post-op Pain Management:    Induction:   PONV Risk Score and Plan: 3  Airway Management Planned: Nasal Cannula, Natural Airway and Simple Face Mask  Additional Equipment:   Intra-op Plan:   Post-operative Plan:   Informed Consent: I have reviewed the patients History and Physical, chart, labs and discussed the procedure including the risks, benefits and alternatives for the proposed anesthesia with the patient or authorized representative who has indicated his/her understanding and acceptance.       Plan Discussed with: Anesthesiologist  Anesthesia Plan Comments: (  )       Anesthesia Quick Evaluation

## 2020-03-14 NOTE — H&P (Signed)
TOTAL HIP ADMISSION H&P  Patient is admitted for left total hip arthroplasty.  Subjective:  Chief Complaint: left hip pain  HPI: Nancy Conrad, 86 y.o. female, has a history of pain and functional disability in the left hip(s) due to arthritis and patient has failed non-surgical conservative treatments for greater than 12 weeks to include corticosteriod injections and activity modification.  Onset of symptoms was gradual starting 2 years ago with gradually worsening course since that time.The patient noted no past surgery on the left hip(s).  Patient currently rates pain in the left hip at 7 out of 10 with activity. Patient has worsening of pain with activity and weight bearing and pain that interfers with activities of daily living. Patient has evidence of joint space narrowing by imaging studies. This condition presents safety issues increasing the risk of falls. There is no current active infection.  Patient Active Problem List   Diagnosis Date Noted  . SVT (supraventricular tachycardia) (HCC) 02/11/2013   Past Medical History:  Diagnosis Date  . Arthritis   . Dyspnea    with exertion  . History of kidney stones    1962  . SVT (supraventricular tachycardia) (HCC)     Past Surgical History:  Procedure Laterality Date  . TONSILLECTOMY    . VAGINAL DELIVERY     x4    No current facility-administered medications for this encounter.   Current Outpatient Medications  Medication Sig Dispense Refill Last Dose  . acetaminophen (TYLENOL) 500 MG tablet Take 1,000 mg by mouth every 6 (six) hours as needed for mild pain.      . B Complex Vitamins (VITAMIN B COMPLEX PO) Take 1 tablet by mouth every Monday, Wednesday, and Friday.      . calcitonin, salmon, (MIACALCIN/FORTICAL) 200 UNIT/ACT nasal spray Place 1 spray into the nose daily.     . calcium carbonate (OS-CAL) 600 MG tablet Take 600 mg by mouth daily.      . Cholecalciferol (VITAMIN D) 50 MCG (2000 UT) tablet Take 2,000 Units by  mouth daily.      . Glucosamine Sulfate 1000 MG CAPS Take 1,000 mg by mouth in the morning and at bedtime.      . Lutein-Zeaxanthin 25-5 MG CAPS Take 1 capsule by mouth daily.     . metoprolol tartrate (LOPRESSOR) 25 MG tablet TAKE 1 TABLET TWICE DAILY. (Patient taking differently: Take 25 mg by mouth 2 (two) times daily. ) 60 tablet 5   . naproxen sodium (ALEVE) 220 MG tablet Take 220 mg by mouth daily as needed (pain).      No Known Allergies  Social History   Tobacco Use  . Smoking status: Never Smoker  . Smokeless tobacco: Never Used  Substance Use Topics  . Alcohol use: Yes    Alcohol/week: 1.0 standard drink    Types: 1 Glasses of wine per week    Comment: socially    Family History  Problem Relation Age of Onset  . CVA Mother   . Heart attack Father      Review of Systems  Constitutional: Negative for chills and fever.  Respiratory: Negative for cough and shortness of breath.   Cardiovascular: Negative for chest pain.  Gastrointestinal: Negative for nausea and vomiting.  Musculoskeletal: Positive for arthralgias.    Objective:  Physical Exam Patient is an 86 year old female.  Well nourished and well developed. General: Alert and oriented x3, cooperative and pleasant, no acute distress. Head: normocephalic, atraumatic, neck supple. Eyes: EOMI. Respiratory: breath  sounds clear in all fields, no wheezing, rales, or rhonchi. Cardiovascular: Regular rate and rhythm, no murmurs, gallops or rubs. Abdomen: non-tender to palpation and soft, normoactive bowel sounds. Musculoskeletal:  Left Hip Exam: The range of motion: Flexion to 90 degrees, No Internal Rotation, External Rotation to 0 degrees, and abduction to 10 degrees without discomfort. There is slight tenderness over the greater trochanteric bursa.  Calves soft and nontender. Motor function intact in LE. Strength 5/5 LE bilaterally. Neuro: Distal pulses 2+. Sensation to light touch intact in LE.  Vital signs  in last 24 hours:    Labs:   Estimated body mass index is 25.51 kg/m as calculated from the following:   Height as of 03/09/20: 5\' 1"  (1.549 m).   Weight as of 03/09/20: 61.2 kg.   Imaging Review Plain radiographs demonstrate severe degenerative joint disease of the left hip(s). The bone quality appears to be adequate for age and reported activity level.      Assessment/Plan:  End stage arthritis, left hip(s)  The patient history, physical examination, clinical judgement of the provider and imaging studies are consistent with end stage degenerative joint disease of the left hip(s) and total hip arthroplasty is deemed medically necessary. The treatment options including medical management, injection therapy, arthroscopy and arthroplasty were discussed at length. The risks and benefits of total hip arthroplasty were presented and reviewed. The risks due to aseptic loosening, infection, stiffness, dislocation/subluxation,  thromboembolic complications and other imponderables were discussed.  The patient acknowledged the explanation, agreed to proceed with the plan and consent was signed. Patient is being admitted for inpatient treatment for surgery, pain control, PT, OT, prophylactic antibiotics, VTE prophylaxis, progressive ambulation and ADL's and discharge planning.The patient is planning to be discharged home.  Therapy Plans: HEP Disposition: Home with daughters Planned DVT Prophylaxis: aspirin 325mg  BID DME needed: walker, 3-n-1 PCP: Dr. 03/11/20, clearance received TXA: IV Allergies: codeine - altered mental status Anesthesia Concerns: none BMI: 25.1 Not diabetic.  Other: May try tramadol for pain relief.  Instructed patient on which medications to discontinue 5 days prior to surgery.  - Patient was instructed on what medications to stop prior to surgery. - Follow-up visit in 2 weeks with Dr. - Begin physical therapy following surgery - Pre-operative lab work as  pre-surgical testing - Prescriptions will be provided in hospital at time of discharge  Duane Lope, PA-C Orthopedic Surgery [facility] [place] [phone]

## 2020-03-15 ENCOUNTER — Observation Stay (HOSPITAL_COMMUNITY)
Admission: RE | Admit: 2020-03-15 | Discharge: 2020-03-16 | Disposition: A | Discharge status: Home / Self Care | Payer: Medicare PPO | Attending: Orthopedic Surgery | Admitting: Orthopedic Surgery

## 2020-03-15 ENCOUNTER — Observation Stay (HOSPITAL_COMMUNITY): Payer: Medicare PPO

## 2020-03-15 ENCOUNTER — Ambulatory Visit (HOSPITAL_COMMUNITY): Payer: Medicare PPO

## 2020-03-15 ENCOUNTER — Other Ambulatory Visit: Payer: Self-pay

## 2020-03-15 ENCOUNTER — Ambulatory Visit (HOSPITAL_COMMUNITY): Payer: Medicare PPO | Admitting: Physician Assistant

## 2020-03-15 ENCOUNTER — Encounter (HOSPITAL_COMMUNITY)
Admission: RE | Disposition: A | Discharge status: Home / Self Care | Payer: Self-pay | Source: Home / Self Care | Attending: Orthopedic Surgery

## 2020-03-15 ENCOUNTER — Encounter (HOSPITAL_COMMUNITY): Payer: Self-pay | Admitting: Orthopedic Surgery

## 2020-03-15 ENCOUNTER — Ambulatory Visit (HOSPITAL_COMMUNITY): Payer: Medicare PPO | Admitting: Anesthesiology

## 2020-03-15 DIAGNOSIS — R531 Weakness: Secondary | ICD-10-CM | POA: Diagnosis not present

## 2020-03-15 DIAGNOSIS — M169 Osteoarthritis of hip, unspecified: Secondary | ICD-10-CM | POA: Diagnosis present

## 2020-03-15 DIAGNOSIS — Z419 Encounter for procedure for purposes other than remedying health state, unspecified: Secondary | ICD-10-CM

## 2020-03-15 DIAGNOSIS — Z96642 Presence of left artificial hip joint: Secondary | ICD-10-CM | POA: Diagnosis not present

## 2020-03-15 DIAGNOSIS — M1612 Unilateral primary osteoarthritis, left hip: Secondary | ICD-10-CM | POA: Diagnosis not present

## 2020-03-15 DIAGNOSIS — M25552 Pain in left hip: Secondary | ICD-10-CM | POA: Diagnosis present

## 2020-03-15 DIAGNOSIS — I471 Supraventricular tachycardia: Secondary | ICD-10-CM | POA: Diagnosis not present

## 2020-03-15 DIAGNOSIS — Z96649 Presence of unspecified artificial hip joint: Secondary | ICD-10-CM

## 2020-03-15 DIAGNOSIS — Z471 Aftercare following joint replacement surgery: Secondary | ICD-10-CM | POA: Diagnosis not present

## 2020-03-15 HISTORY — PX: TOTAL HIP ARTHROPLASTY: SHX124

## 2020-03-15 LAB — TYPE AND SCREEN
ABO/RH(D): A POS
Antibody Screen: NEGATIVE

## 2020-03-15 LAB — ABO/RH: ABO/RH(D): A POS

## 2020-03-15 SURGERY — ARTHROPLASTY, HIP, TOTAL, ANTERIOR APPROACH
Anesthesia: General | Site: Hip | Laterality: Left

## 2020-03-15 MED ORDER — TRANEXAMIC ACID-NACL 1000-0.7 MG/100ML-% IV SOLN
1000.0000 mg | INTRAVENOUS | Status: AC
  Administered 2020-03-15: 1000 mg via INTRAVENOUS
  Filled 2020-03-15: qty 100

## 2020-03-15 MED ORDER — PROPOFOL 500 MG/50ML IV EMUL
INTRAVENOUS | Status: DC | PRN
  Administered 2020-03-15: 75 ug/kg/min via INTRAVENOUS

## 2020-03-15 MED ORDER — MORPHINE SULFATE (PF) 2 MG/ML IV SOLN: 0.5000 mg | INTRAVENOUS | Status: DC | PRN

## 2020-03-15 MED ORDER — CEFAZOLIN SODIUM-DEXTROSE 2-4 GM/100ML-% IV SOLN
2.0000 g | Freq: Four times a day (QID) | INTRAVENOUS | Status: AC
  Administered 2020-03-15 (×2): 2 g via INTRAVENOUS
  Filled 2020-03-15 (×2): qty 100

## 2020-03-15 MED ORDER — CHLORHEXIDINE GLUCONATE 0.12 % MT SOLN
15.0000 mL | Freq: Once | OROMUCOSAL | Status: AC
  Administered 2020-03-15: 15 mL via OROMUCOSAL

## 2020-03-15 MED ORDER — TRAMADOL HCL 50 MG PO TABS: 50.0000 mg | ORAL_TABLET | Freq: Four times a day (QID) | ORAL | Status: DC | PRN

## 2020-03-15 MED ORDER — DEXAMETHASONE SODIUM PHOSPHATE 10 MG/ML IJ SOLN: 8.0000 mg | Freq: Once | INTRAMUSCULAR | Status: DC

## 2020-03-15 MED ORDER — ALBUMIN HUMAN 5 % IV SOLN
INTRAVENOUS | Status: AC
  Filled 2020-03-15: qty 250

## 2020-03-15 MED ORDER — POVIDONE-IODINE 10 % EX SWAB
2.0000 "application " | Freq: Once | CUTANEOUS | Status: AC
  Administered 2020-03-15: 2 via TOPICAL

## 2020-03-15 MED ORDER — PHENYLEPHRINE HCL-NACL 10-0.9 MG/250ML-% IV SOLN
INTRAVENOUS | Status: AC
  Filled 2020-03-15: qty 750

## 2020-03-15 MED ORDER — METHOCARBAMOL 500 MG PO TABS
500.0000 mg | ORAL_TABLET | Freq: Four times a day (QID) | ORAL | Status: DC | PRN
  Administered 2020-03-15 – 2020-03-16 (×2): 500 mg via ORAL
  Filled 2020-03-15 (×2): qty 1

## 2020-03-15 MED ORDER — PHENOL 1.4 % MT LIQD: 1.0000 | OROMUCOSAL | Status: DC | PRN

## 2020-03-15 MED ORDER — PHENYLEPHRINE HCL-NACL 10-0.9 MG/250ML-% IV SOLN
INTRAVENOUS | Status: DC | PRN
  Administered 2020-03-15: 25 ug/min via INTRAVENOUS

## 2020-03-15 MED ORDER — ACETAMINOPHEN 160 MG/5ML PO SOLN: 325.0000 mg | ORAL | Status: DC | PRN

## 2020-03-15 MED ORDER — ACETAMINOPHEN 10 MG/ML IV SOLN
1000.0000 mg | Freq: Four times a day (QID) | INTRAVENOUS | Status: DC
  Administered 2020-03-15: 1000 mg via INTRAVENOUS
  Filled 2020-03-15: qty 100

## 2020-03-15 MED ORDER — LACTATED RINGERS IV SOLN: INTRAVENOUS | Status: DC

## 2020-03-15 MED ORDER — METOPROLOL TARTRATE 25 MG PO TABS
25.0000 mg | ORAL_TABLET | Freq: Two times a day (BID) | ORAL | Status: DC
  Administered 2020-03-15 – 2020-03-16 (×2): 25 mg via ORAL
  Filled 2020-03-15 (×2): qty 1

## 2020-03-15 MED ORDER — HYDROCODONE-ACETAMINOPHEN 5-325 MG PO TABS
1.0000 | ORAL_TABLET | ORAL | Status: DC | PRN
  Administered 2020-03-15 (×2): 2 via ORAL
  Administered 2020-03-15: 1 via ORAL
  Administered 2020-03-16 (×4): 2 via ORAL
  Filled 2020-03-15 (×2): qty 2
  Filled 2020-03-15: qty 1
  Filled 2020-03-15 (×4): qty 2

## 2020-03-15 MED ORDER — 0.9 % SODIUM CHLORIDE (POUR BTL) OPTIME
TOPICAL | Status: DC | PRN
  Administered 2020-03-15 (×2): 1000 mL

## 2020-03-15 MED ORDER — METHOCARBAMOL 500 MG IVPB - SIMPLE MED
500.0000 mg | Freq: Four times a day (QID) | INTRAVENOUS | Status: DC | PRN
  Filled 2020-03-15: qty 50

## 2020-03-15 MED ORDER — POLYETHYLENE GLYCOL 3350 17 G PO PACK: 17.0000 g | PACK | Freq: Every day | ORAL | Status: DC | PRN

## 2020-03-15 MED ORDER — BISACODYL 10 MG RE SUPP: 10.0000 mg | Freq: Every day | RECTAL | Status: DC | PRN

## 2020-03-15 MED ORDER — MAGNESIUM CITRATE PO SOLN: 1.0000 | Freq: Once | ORAL | Status: DC | PRN

## 2020-03-15 MED ORDER — FENTANYL CITRATE (PF) 100 MCG/2ML IJ SOLN
INTRAMUSCULAR | Status: AC
  Filled 2020-03-15: qty 2

## 2020-03-15 MED ORDER — BUPIVACAINE IN DEXTROSE 0.75-8.25 % IT SOLN
INTRATHECAL | Status: DC | PRN
  Administered 2020-03-15: 1.6 mL via INTRATHECAL

## 2020-03-15 MED ORDER — METOCLOPRAMIDE HCL 5 MG PO TABS: 5.0000 mg | ORAL_TABLET | Freq: Three times a day (TID) | ORAL | Status: DC | PRN

## 2020-03-15 MED ORDER — CHLORHEXIDINE GLUCONATE CLOTH 2 % EX PADS: 6.0000 | MEDICATED_PAD | Freq: Every day | CUTANEOUS | Status: DC

## 2020-03-15 MED ORDER — BUPIVACAINE HCL 0.25 % IJ SOLN
INTRAMUSCULAR | Status: DC | PRN
  Administered 2020-03-15: 30 mL

## 2020-03-15 MED ORDER — BUPIVACAINE HCL (PF) 0.25 % IJ SOLN
INTRAMUSCULAR | Status: AC
  Filled 2020-03-15: qty 30

## 2020-03-15 MED ORDER — OXYCODONE HCL 5 MG/5ML PO SOLN: 5.0000 mg | Freq: Once | ORAL | Status: DC | PRN

## 2020-03-15 MED ORDER — METHOCARBAMOL 500 MG PO TABS: 500.0000 mg | ORAL_TABLET | Freq: Four times a day (QID) | ORAL | 0 refills | Status: DC | PRN

## 2020-03-15 MED ORDER — ONDANSETRON HCL 4 MG PO TABS: 4.0000 mg | ORAL_TABLET | Freq: Four times a day (QID) | ORAL | Status: DC | PRN

## 2020-03-15 MED ORDER — FENTANYL CITRATE (PF) 100 MCG/2ML IJ SOLN: 25.0000 ug | INTRAMUSCULAR | Status: DC | PRN

## 2020-03-15 MED ORDER — ONDANSETRON HCL 4 MG/2ML IJ SOLN
INTRAMUSCULAR | Status: AC
  Filled 2020-03-15: qty 2

## 2020-03-15 MED ORDER — ONDANSETRON HCL 4 MG/2ML IJ SOLN: 4.0000 mg | Freq: Once | INTRAMUSCULAR | Status: DC | PRN

## 2020-03-15 MED ORDER — CEFAZOLIN SODIUM-DEXTROSE 2-4 GM/100ML-% IV SOLN
2.0000 g | INTRAVENOUS | Status: AC
  Administered 2020-03-15: 2 g via INTRAVENOUS
  Filled 2020-03-15: qty 100

## 2020-03-15 MED ORDER — ASPIRIN 325 MG PO TBEC: 325.0000 mg | DELAYED_RELEASE_TABLET | Freq: Two times a day (BID) | ORAL | 0 refills | Status: DC

## 2020-03-15 MED ORDER — ASPIRIN EC 325 MG PO TBEC
325.0000 mg | DELAYED_RELEASE_TABLET | Freq: Two times a day (BID) | ORAL | Status: DC
  Administered 2020-03-16: 325 mg via ORAL
  Filled 2020-03-15: qty 1

## 2020-03-15 MED ORDER — ORAL CARE MOUTH RINSE: 15.0000 mL | Freq: Once | OROMUCOSAL | Status: AC

## 2020-03-15 MED ORDER — HYDROCODONE-ACETAMINOPHEN 5-325 MG PO TABS
1.0000 | ORAL_TABLET | Freq: Four times a day (QID) | ORAL | 0 refills | Status: DC | PRN
Start: 2020-03-15 — End: 2020-03-16

## 2020-03-15 MED ORDER — PROPOFOL 10 MG/ML IV BOLUS
INTRAVENOUS | Status: AC
  Filled 2020-03-15: qty 40

## 2020-03-15 MED ORDER — DOCUSATE SODIUM 100 MG PO CAPS
100.0000 mg | ORAL_CAPSULE | Freq: Two times a day (BID) | ORAL | Status: DC
  Administered 2020-03-15 – 2020-03-16 (×3): 100 mg via ORAL
  Filled 2020-03-15 (×3): qty 1

## 2020-03-15 MED ORDER — DEXAMETHASONE SODIUM PHOSPHATE 10 MG/ML IJ SOLN
INTRAMUSCULAR | Status: AC
  Filled 2020-03-15: qty 1

## 2020-03-15 MED ORDER — TRAMADOL HCL 50 MG PO TABS
50.0000 mg | ORAL_TABLET | Freq: Four times a day (QID) | ORAL | 0 refills | Status: DC | PRN
Start: 2020-03-15 — End: 2020-03-16

## 2020-03-15 MED ORDER — PROPOFOL 500 MG/50ML IV EMUL
INTRAVENOUS | Status: AC
  Filled 2020-03-15: qty 50

## 2020-03-15 MED ORDER — ONDANSETRON HCL 4 MG/2ML IJ SOLN: 4.0000 mg | Freq: Four times a day (QID) | INTRAMUSCULAR | Status: DC | PRN

## 2020-03-15 MED ORDER — ACETAMINOPHEN 325 MG PO TABS: 325.0000 mg | ORAL_TABLET | Freq: Four times a day (QID) | ORAL | Status: DC | PRN

## 2020-03-15 MED ORDER — FENTANYL CITRATE (PF) 100 MCG/2ML IJ SOLN
INTRAMUSCULAR | Status: DC | PRN
  Administered 2020-03-15: 50 ug via INTRAVENOUS
  Administered 2020-03-15: 25 ug via INTRAVENOUS

## 2020-03-15 MED ORDER — MEPERIDINE HCL 50 MG/ML IJ SOLN: 6.2500 mg | INTRAMUSCULAR | Status: DC | PRN

## 2020-03-15 MED ORDER — SODIUM CHLORIDE 0.9 % IV SOLN: INTRAVENOUS | Status: DC

## 2020-03-15 MED ORDER — WATER FOR IRRIGATION, STERILE IR SOLN
Status: DC | PRN
  Administered 2020-03-15: 2000 mL

## 2020-03-15 MED ORDER — DEXAMETHASONE SODIUM PHOSPHATE 10 MG/ML IJ SOLN
10.0000 mg | Freq: Once | INTRAMUSCULAR | Status: AC
  Administered 2020-03-16: 10 mg via INTRAVENOUS
  Filled 2020-03-15: qty 1

## 2020-03-15 MED ORDER — ALBUMIN HUMAN 5 % IV SOLN: INTRAVENOUS | Status: DC | PRN

## 2020-03-15 MED ORDER — ACETAMINOPHEN 325 MG PO TABS: 325.0000 mg | ORAL_TABLET | ORAL | Status: DC | PRN

## 2020-03-15 MED ORDER — MENTHOL 3 MG MT LOZG: 1.0000 | LOZENGE | OROMUCOSAL | Status: DC | PRN

## 2020-03-15 MED ORDER — PROPOFOL 1000 MG/100ML IV EMUL
INTRAVENOUS | Status: AC
  Filled 2020-03-15: qty 100

## 2020-03-15 MED ORDER — DEXAMETHASONE SODIUM PHOSPHATE 10 MG/ML IJ SOLN
INTRAMUSCULAR | Status: DC | PRN
  Administered 2020-03-15: 4 mg via INTRAVENOUS

## 2020-03-15 MED ORDER — OXYCODONE HCL 5 MG PO TABS: 5.0000 mg | ORAL_TABLET | Freq: Once | ORAL | Status: DC | PRN

## 2020-03-15 MED ORDER — ONDANSETRON HCL 4 MG/2ML IJ SOLN
INTRAMUSCULAR | Status: DC | PRN
  Administered 2020-03-15: 4 mg via INTRAVENOUS

## 2020-03-15 MED ORDER — METOCLOPRAMIDE HCL 5 MG/ML IJ SOLN: 5.0000 mg | Freq: Three times a day (TID) | INTRAMUSCULAR | Status: DC | PRN

## 2020-03-15 SURGICAL SUPPLY — 45 items
BAG DECANTER FOR FLEXI CONT (MISCELLANEOUS) IMPLANT
BAG ZIPLOCK 12X15 (MISCELLANEOUS) IMPLANT
BLADE SAG 18X100X1.27 (BLADE) ×3 IMPLANT
CLOSURE WOUND 1/2 X4 (GAUZE/BANDAGES/DRESSINGS) ×1
COVER PERINEAL POST (MISCELLANEOUS) ×3 IMPLANT
COVER SURGICAL LIGHT HANDLE (MISCELLANEOUS) ×3 IMPLANT
COVER WAND RF STERILE (DRAPES) IMPLANT
CUP ACETBLR 52 OD PINNACLE (Hips) ×3 IMPLANT
DECANTER SPIKE VIAL GLASS SM (MISCELLANEOUS) ×3 IMPLANT
DRAPE STERI IOBAN 125X83 (DRAPES) ×3 IMPLANT
DRAPE U-SHAPE 47X51 STRL (DRAPES) ×6 IMPLANT
DRSG ADAPTIC 3X8 NADH LF (GAUZE/BANDAGES/DRESSINGS) ×3 IMPLANT
DRSG AQUACEL AG ADV 3.5X10 (GAUZE/BANDAGES/DRESSINGS) ×3 IMPLANT
DURAPREP 26ML APPLICATOR (WOUND CARE) ×3 IMPLANT
ELECT REM PT RETURN 15FT ADLT (MISCELLANEOUS) ×3 IMPLANT
EVACUATOR 1/8 PVC DRAIN (DRAIN) IMPLANT
GLOVE BIO SURGEON STRL SZ 6 (GLOVE) IMPLANT
GLOVE BIO SURGEON STRL SZ7 (GLOVE) IMPLANT
GLOVE BIO SURGEON STRL SZ8 (GLOVE) ×3 IMPLANT
GLOVE BIOGEL PI IND STRL 6.5 (GLOVE) IMPLANT
GLOVE BIOGEL PI IND STRL 7.0 (GLOVE) IMPLANT
GLOVE BIOGEL PI IND STRL 8 (GLOVE) ×1 IMPLANT
GLOVE BIOGEL PI INDICATOR 6.5 (GLOVE)
GLOVE BIOGEL PI INDICATOR 7.0 (GLOVE)
GLOVE BIOGEL PI INDICATOR 8 (GLOVE) ×2
GOWN STRL REUS W/TWL LRG LVL3 (GOWN DISPOSABLE) ×3 IMPLANT
GOWN STRL REUS W/TWL XL LVL3 (GOWN DISPOSABLE) IMPLANT
HEAD FEM STD 32X+1 STRL (Hips) ×3 IMPLANT
HOLDER FOLEY CATH W/STRAP (MISCELLANEOUS) ×3 IMPLANT
KIT TURNOVER KIT A (KITS) IMPLANT
LINER MARATHON NEUT +4X52X32 (Hips) ×3 IMPLANT
MANIFOLD NEPTUNE II (INSTRUMENTS) ×3 IMPLANT
PACK ANTERIOR HIP CUSTOM (KITS) ×3 IMPLANT
PENCIL SMOKE EVACUATOR COATED (MISCELLANEOUS) ×3 IMPLANT
SCREW 6.5MMX25MM (Screw) ×6 IMPLANT
STEM FEMORAL SZ5 HIGH ACTIS (Stem) ×3 IMPLANT
STRIP CLOSURE SKIN 1/2X4 (GAUZE/BANDAGES/DRESSINGS) ×2 IMPLANT
SUT ETHIBOND NAB CT1 #1 30IN (SUTURE) ×3 IMPLANT
SUT MNCRL AB 4-0 PS2 18 (SUTURE) ×3 IMPLANT
SUT STRATAFIX 0 PDS 27 VIOLET (SUTURE) ×3
SUT VIC AB 2-0 CT1 27 (SUTURE) ×6
SUT VIC AB 2-0 CT1 TAPERPNT 27 (SUTURE) ×2 IMPLANT
SUTURE STRATFX 0 PDS 27 VIOLET (SUTURE) ×1 IMPLANT
SYR 50ML LL SCALE MARK (SYRINGE) IMPLANT
TRAY FOLEY MTR SLVR 16FR STAT (SET/KITS/TRAYS/PACK) ×3 IMPLANT

## 2020-03-15 NOTE — Progress Notes (Signed)
   03/15/20 2134  Assess: MEWS Score  Temp 98.8 F (37.1 C)  BP 102/65  Pulse Rate (!) 160 (160)  Resp 20  Level of Consciousness Alert  SpO2 100 %  O2 Device Nasal Cannula  O2 Flow Rate (L/min) 2 L/min  Assess: MEWS Score  MEWS Temp 0  MEWS Systolic 0  MEWS Pulse 3  MEWS RR 0  MEWS LOC 0  MEWS Score 3  MEWS Score Color Yellow  Assess: if the MEWS score is Yellow or Red  Were vital signs taken at a resting state? Yes  Focused Assessment Change from prior assessment (see assessment flowsheet)  Early Detection of Sepsis Score *See Row Information* Low  MEWS guidelines implemented *See Row Information* Yes  Treat  MEWS Interventions Administered scheduled meds/treatments  Pain Scale 0-10  Pain Score 5  Pain Type Surgical pain  Pain Location Hip  Pain Orientation Left  Pain Descriptors / Indicators Aching  Pain Frequency Intermittent  Pain Onset With Activity  Patients Stated Pain Goal 3  Pain Intervention(s) Repositioned;Cold applied  Multiple Pain Sites No  Complains of Other (Comment) (elevated pulse)  Interventions Medication (see MAR) (refused transfer to tele-unit)  Patients response to intervention Relief (pulse normalized after patient pressed on sternum and coughe)  Take Vital Signs  Increase Vital Sign Frequency  Yellow: Q 2hr X 2 then Q 4hr X 2, if remains yellow, continue Q 4hrs  Escalate  MEWS: Escalate Yellow: discuss with charge nurse/RN and consider discussing with provider and RRT  Notify: Charge Nurse/RN  Name of Charge Nurse/RN Notified Zerita Boers RN  Date Charge Nurse/RN Notified 03/15/20  Time Charge Nurse/RN Notified 2136  Notify: Provider  Provider Name/Title Inda Merlin MD  Date Provider Notified 03/15/20  Time Provider Notified 2145  Notification Type Page  Notification Reason Change in status  Response No new orders (pulse normalized with interventions)  Date of Provider Response 03/15/20  Time of Provider Response 2145   Document  Patient Outcome Stabilized after interventions  Progress note created (see row info)  (metoprolol given, 12 lead EKG on chart, Patient "I'm OK")

## 2020-03-15 NOTE — Care Plan (Signed)
Ortho Bundle Case Management Note  Patient Details  Name: Nancy Conrad MRN: [id number] Date of Birth: 05-05-1931                  L THA on 03/15/20. DCP: Home with daughter, Camelia Eng. 1 story home with 1-3 steps. DME: RW & 3in1 ordered through [employer]. PT: HEP   DME Arranged:  Walker rolling, 3-N-1 DME Agency:  [employer]  HH Arranged:    HH Agency:     Additional Comments: Please contact me with any questions of if this plan should need to change.  Ennis Forts, RN,CCM [facility]  [phone] 03/15/2020, 3:05 PM

## 2020-03-15 NOTE — Anesthesia Postprocedure Evaluation (Signed)
Anesthesia Post Note  Patient: Nancy Conrad  Procedure(s) Performed: TOTAL HIP ARTHROPLASTY ANTERIOR APPROACH (Left Hip)     Patient location during evaluation: PACU Anesthesia Type: Spinal and MAC Level of consciousness: awake and alert Pain management: pain level controlled Vital Signs Assessment: post-procedure vital signs reviewed and stable Respiratory status: spontaneous breathing, nonlabored ventilation, respiratory function stable and patient connected to nasal cannula oxygen Cardiovascular status: blood pressure returned to baseline and stable Postop Assessment: no apparent nausea or vomiting Anesthetic complications: no   No complications documented.  Last Vitals:  Vitals:   03/15/20 1011 03/15/20 1112  BP: 129/78 (!) 153/109  Pulse: (!) 59 66  Resp: 17 18  Temp: 36.4 C 36.6 C  SpO2: 100% 100%    Last Pain:  Vitals:   03/15/20 1211  TempSrc:   PainSc: 2                  Koi Zangara

## 2020-03-15 NOTE — Interval H&P Note (Signed)
History and Physical Interval Note:  03/15/2020 6:50 AM  Nancy Conrad  has presented today for surgery, with the diagnosis of left hip osteoarthritis.  The various methods of treatment have been discussed with the patient and family. After consideration of risks, benefits and other options for treatment, the patient has consented to  Procedure(s) with comments: TOTAL HIP ARTHROPLASTY ANTERIOR APPROACH (Left) - as a surgical intervention.  The patient's history has been reviewed, patient examined, no change in status, stable for surgery.  I have reviewed the patient's chart and labs.  Questions were answered to the patient's satisfaction.     Homero Fellers Pailyn [place]

## 2020-03-15 NOTE — Anesthesia Procedure Notes (Signed)
Spinal  Patient location during procedure: OR Start time: 03/15/2020 7:15 AM End time: 03/15/2020 7:20 AM Staffing Anesthesiologist: Bethena Midget, MD Preanesthetic Checklist Completed: patient identified, IV checked, site marked, risks and benefits discussed, surgical consent, monitors and equipment checked, pre-op evaluation and timeout performed Spinal Block Patient position: sitting Prep: DuraPrep Patient monitoring: heart rate, cardiac monitor, continuous pulse ox and blood pressure Approach: midline Location: L4-5 Injection technique: single-shot Needle Needle type: Sprotte  Needle gauge: 24 G Needle length: 9 cm Assessment Sensory level: T4

## 2020-03-15 NOTE — Discharge Instructions (Signed)
Nancy Aluisio, MD Total Joint Specialist [facility] [address] [place], [place] [address] [phone]  ANTERIOR APPROACH TOTAL HIP REPLACEMENT POSTOPERATIVE DIRECTIONS     Hip Rehabilitation, Guidelines Following Surgery  The results of a hip operation are greatly improved after range of motion and muscle strengthening exercises. Follow all safety measures which are given to protect your hip. If any of these exercises cause increased pain or swelling in your joint, decrease the amount until you are comfortable again. Then slowly increase the exercises. Call your caregiver if you have problems or questions.   BLOOD CLOT PREVENTION . Take a 325 mg Aspirin two times a day for three weeks following surgery. Then take an 81 mg Aspirin once a day for three weeks. Then discontinue Aspirin. . You may resume your vitamins/supplements upon discharge from the hospital. . Do not take any NSAIDs (Advil, Aleve, Ibuprofen, Meloxicam, etc.) until you have discontinued the 325 mg Aspirin.  HOME CARE INSTRUCTIONS  . Remove items at home which could result in a fall. This includes throw rugs or furniture in walking pathways.   ICE to the affected hip as frequently as 20-30 minutes an hour and then as needed for pain and swelling. Continue to use ice on the hip for pain and swelling from surgery. You may notice swelling that will progress down to the foot and ankle. This is normal after surgery. Elevate the leg when you are not up walking on it.    Continue to use the breathing machine which will help keep your temperature down.  It is common for your temperature to cycle up and down following surgery, especially at night when you are not up moving around and exerting yourself.  The breathing machine keeps your lungs expanded and your temperature down.  DIET You may resume your previous home diet once your are discharged from the hospital.  DRESSING / WOUND CARE /  SHOWERING . You have an adhesive waterproof bandage over the incision. Leave this in place until your first follow-up appointment. Once you remove this you will not need to place another bandage.  . You may begin showering 3 days following surgery, but do not submerge the incision under water.  ACTIVITY . For the first 3-5 days, it is important to rest and keep the operative leg elevated. You should, as a general rule, rest for 50 minutes and walk/stretch for 10 minutes per hour. After 5 days, you may slowly increase activity as tolerated.  . Perform the exercises you were provided twice a day for about 15-20 minutes each session. Begin these 2 days following surgery. . Walk with your walker as instructed. Use the walker until you are comfortable transitioning to a cane. Walk with the cane in the opposite hand of the operative leg. You may discontinue the cane once you are comfortable and walking steadily. . Avoid periods of inactivity such as sitting longer than an hour when not asleep. This helps prevent blood clots.  . Do not drive a car for 6 weeks or until released by your surgeon.  . Do not drive while taking narcotics.  TED HOSE STOCKINGS Wear the elastic stockings on both legs for three weeks following surgery during the day. You may remove them at night while sleeping.  WEIGHT BEARING Weight bearing as tolerated with assist device (walker, cane, etc) as directed, use it as long as suggested by your surgeon or therapist, typically at least 4-6 weeks.  POSTOPERATIVE CONSTIPATION PROTOCOL Constipation -   defined medically as fewer than three stools per week and severe constipation as less than one stool per week.  One of the most common issues patients have following surgery is constipation.  Even if you have a regular bowel pattern at home, your normal regimen is likely to be disrupted due to multiple reasons following surgery.  Combination of anesthesia, postoperative narcotics, change in  appetite and fluid intake all can affect your bowels.  In order to avoid complications following surgery, here are some recommendations in order to help you during your recovery period.  . Colace (docusate) - Pick up an over-the-counter form of Colace or another stool softener and take twice a day as long as you are requiring postoperative pain medications.  Take with a full glass of water daily.  If you experience loose stools or diarrhea, hold the colace until you stool forms back up.  If your symptoms do not get better within 1 week or if they get worse, check with your doctor. . Dulcolax (bisacodyl) - Pick up over-the-counter and take as directed by the product packaging as needed to assist with the movement of your bowels.  Take with a full glass of water.  Use this product as needed if not relieved by Colace only.  . MiraLax (polyethylene glycol) - Pick up over-the-counter to have on hand.  MiraLax is a solution that will increase the amount of water in your bowels to assist with bowel movements.  Take as directed and can mix with a glass of water, juice, soda, coffee, or tea.  Take if you go more than two days without a movement.Do not use MiraLax more than once per day. Call your doctor if you are still constipated or irregular after using this medication for 7 days in a row.  If you continue to have problems with postoperative constipation, please contact the office for further assistance and recommendations.  If you experience "the worst abdominal pain ever" or develop nausea or vomiting, please contact the office immediatly for further recommendations for treatment.  ITCHING  If you experience itching with your medications, try taking only a single pain pill, or even half a pain pill at a time.  You can also use Benadryl over the counter for itching or also to help with sleep.   MEDICATIONS See your medication summary on the "After Visit Summary" that the nursing staff will review with you  prior to discharge.  You may have some home medications which will be placed on hold until you complete the course of blood thinner medication.  It is important for you to complete the blood thinner medication as prescribed by your surgeon.  Continue your approved medications as instructed at time of discharge.  PRECAUTIONS If you experience chest pain or shortness of breath - call 911 immediately for transfer to the hospital emergency department.  If you develop a fever greater that 101 F, purulent drainage from wound, increased redness or drainage from wound, foul odor from the wound/dressing, or calf pain - CONTACT YOUR SURGEON.                                                   FOLLOW-UP APPOINTMENTS Make sure you keep all of your appointments after your operation with your surgeon and caregivers. You should call the office at the above phone number and   make an appointment for approximately two weeks after the date of your surgery or on the date instructed by your surgeon outlined in the "After Visit Summary".  RANGE OF MOTION AND STRENGTHENING EXERCISES  These exercises are designed to help you keep full movement of your hip joint. Follow your caregiver's or physical therapist's instructions. Perform all exercises about fifteen times, three times per day or as directed. Exercise both hips, even if you have had only one joint replacement. These exercises can be done on a training (exercise) mat, on the floor, on a table or on a bed. Use whatever works the best and is most comfortable for you. Use music or television while you are exercising so that the exercises are a pleasant break in your day. This will make your life better with the exercises acting as a break in routine you can look forward to.  . Lying on your back, slowly slide your foot toward your buttocks, raising your knee up off the floor. Then slowly slide your foot back down until your leg is straight again.  . Lying on your back spread  your legs as far apart as you can without causing discomfort.  . Lying on your side, raise your upper leg and foot straight up from the floor as far as is comfortable. Slowly lower the leg and repeat.  . Lying on your back, tighten up the muscle in the front of your thigh (quadriceps muscles). You can do this by keeping your leg straight and trying to raise your heel off the floor. This helps strengthen the largest muscle supporting your knee.  . Lying on your back, tighten up the muscles of your buttocks both with the legs straight and with the knee bent at a comfortable angle while keeping your heel on the floor.   IF YOU ARE TRANSFERRED TO A SKILLED REHAB FACILITY If the patient is transferred to a skilled rehab facility following release from the hospital, a list of the current medications will be sent to the facility for the patient to continue.  When discharged from the skilled rehab facility, please have the facility set up the patient's Home Health Physical Therapy prior to being released. Also, the skilled facility will be responsible for providing the patient with their medications at time of release from the facility to include their pain medication, the muscle relaxants, and their blood thinner medication. If the patient is still at the rehab facility at time of the two week follow up appointment, the skilled rehab facility will also need to assist the patient in arranging follow up appointment in our office and any transportation needs.  MAKE SURE YOU:  . Understand these instructions.  . Get help right away if you are not doing well or get worse.    DENTAL ANTIBIOTICS:  In most cases prophylactic antibiotics for Dental procdeures after total joint surgery are not necessary.  Exceptions are as follows:  1. History of prior total joint infection  2. Severely immunocompromised (Organ Transplant, cancer chemotherapy, Rheumatoid biologic meds such as Humera)  3. Poorly controlled  diabetes (A1C &gt; 8.0, blood glucose over 200)  If you have one of these conditions, contact your surgeon for an antibiotic prescription, prior to your dental procedure.    Pick up stool softner and laxative for home use following surgery while on pain medications. Do not submerge incision under water. Please use good hand washing techniques while changing dressing each day. May shower starting three days after surgery. Please   use a clean towel to pat the incision dry following showers. Continue to use ice for pain and swelling after surgery. Do not use any lotions or creams on the incision until instructed by your surgeon.  

## 2020-03-15 NOTE — Op Note (Signed)
OPERATIVE REPORT- TOTAL HIP ARTHROPLASTY   PREOPERATIVE DIAGNOSIS: Osteoarthritis of the Left hip.   POSTOPERATIVE DIAGNOSIS: Osteoarthritis of the Left  hip.   PROCEDURE: Left total hip arthroplasty, anterior approach.   SURGEON: Ollen Gross, MD   ASSISTANT: Dennie Bible, PA-C  ANESTHESIA:  Spinal  ESTIMATED BLOOD LOSS:-500 mL    DRAINS: Hemovac x1.   COMPLICATIONS: None   CONDITION: PACU - hemodynamically stable.   BRIEF CLINICAL NOTE: Nancy Conrad is a 86 y.o. female who has advanced end-  stage arthritis of their Left  hip with progressively worsening pain and  dysfunction.The patient has failed nonoperative management and presents for  total hip arthroplasty.   PROCEDURE IN DETAIL: After successful administration of spinal  anesthetic, the traction boots for the [facility] bed were placed on both  feet and the patient was placed onto the [facility] bed, boots placed into the leg  holders. The Left hip was then isolated from the perineum with plastic  drapes and prepped and draped in the usual sterile fashion. ASIS and  greater trochanter were marked and a oblique incision was made, starting  at about 1 cm lateral and 2 cm distal to the ASIS and coursing towards  the anterior cortex of the femur. The skin was cut with a 10 blade  through subcutaneous tissue to the level of the fascia overlying the  tensor fascia lata muscle. The fascia was then incised in line with the  incision at the junction of the anterior third and posterior 2/3rd. The  muscle was teased off the fascia and then the interval between the TFL  and the rectus was developed. The Hohmann retractor was then placed at  the top of the femoral neck over the capsule. The vessels overlying the  capsule were cauterized and the fat on top of the capsule was removed.  A Hohmann retractor was then placed anterior underneath the rectus  femoris to give exposure to the entire anterior capsule. A T-shaped   capsulotomy was performed. The edges were tagged and the femoral head  was identified.       Osteophytes are removed off the superior acetabulum.  The femoral neck was then cut in situ with an oscillating saw. Traction  was then applied to the left lower extremity utilizing the [facility]  traction. The femoral head was then removed. Retractors were placed  around the acetabulum and then circumferential removal of the labrum was  performed. Osteophytes were also removed. Reaming starts at 45 mm to  medialize and  Increased in 2 mm increments to 51 mm. We reamed in  approximately 40 degrees of abduction, 20 degrees anteversion. She had a protrusio deformity so I placed cancellous reamings at the base of the acetabulum to help fill the defect.A 52 mm  pinnacle acetabular shell was then impacted in anatomic position under  fluoroscopic guidance with excellent purchase. We placed 2 additional dome screws.  A 32 mm neutral + 4 marathon liner was then  placed into the acetabular shell.       The femoral lift was then placed along the lateral aspect of the femur  just distal to the vastus ridge. The leg was  externally rotated and capsule  was stripped off the inferior aspect of the femoral neck down to the  level of the lesser trochanter, this was done with electrocautery. The femur was lifted after this was performed. The  leg was then placed in an extended and adducted position  essentially delivering the femur. We also removed the capsule superiorly and the piriformis from the piriformis fossa to gain excellent exposure of the  proximal femur. Rongeur was used to remove some cancellous bone to get  into the lateral portion of the proximal femur for placement of the  initial starter reamer. The starter broaches was placed  the starter broach  and was shown to go down the center of the canal. Broaching  with the Actis system was then performed starting at size 0  coursing  Up to size 5. A size 5 had  excellent torsional and rotational  and axial stability. The trial high offset neck was then placed  with a 32 + 1 trial head. The hip was then reduced. We confirmed that  the stem was in the canal both on AP and lateral x-rays. It also has excellent sizing. The hip was reduced with outstanding stability through full extension and full external rotation.. AP pelvis was taken and the leg lengths were measured and found to be equal. Hip was then dislocated again and the femoral head and neck removed. The  femoral broach was removed. Size 5 Actis stem with a high offset  neck was then impacted into the femur following native anteversion. Has  excellent purchase in the canal. Excellent torsional and rotational and  axial stability. It is confirmed to be in the canal on AP and lateral  fluoroscopic views. The 32 + 1 metal head was placed and the hip  reduced with outstanding stability. Again AP pelvis was taken and it  confirmed that the leg lengths were equal. The wound was then copiously  irrigated with saline solution and the capsule reattached and repaired  with Ethibond suture. 30 ml of .25% Bupivicaine was  injected into the capsule and into the edge of the tensor fascia lata as well as subcutaneous tissue. The fascia overlying the tensor fascia lata was then closed with a running #1 V-Loc. Subcu was closed with interrupted 2-0 Vicryl and subcuticular running 4-0 Monocryl. Incision was cleaned  and dried. Steri-Strips and a bulky sterile dressing applied. Hemovac  drain was hooked to suction and then the patient was awakened and transported to  recovery in stable condition.        Please note that a surgical assistant was a medical necessity for this procedure to perform it in a safe and expeditious manner. Assistant was necessary to provide appropriate retraction of vital neurovascular structures and to prevent femoral fracture and allow for anatomic placement of the prosthesis.  Ollen Gross,  M.D.

## 2020-03-15 NOTE — Transfer of Care (Signed)
Immediate Anesthesia Transfer of Care Note  Patient: Nancy Conrad  Procedure(s) Performed: Procedure(s) with comments: TOTAL HIP ARTHROPLASTY ANTERIOR APPROACH (Left) -  Patient Location: PACU  Anesthesia Type:Spinal  Level of Consciousness:  sedated, patient cooperative and responds to stimulation  Airway & Oxygen Therapy:Patient Spontanous Breathing and Patient connected to face mask oxgen  Post-op Assessment:  Report given to PACU RN and Post -op Vital signs reviewed and stable  Post vital signs:  Reviewed and stable  Last Vitals:  Vitals:   03/15/20 0618  BP: (!) 145/84  Pulse: 66  Resp: 16  Temp: 37.2 C  SpO2: 95%    Complications: No apparent anesthesia complications

## 2020-03-15 NOTE — TOC Transition Note (Signed)
Transition of Care [facility]) - CM/SW Discharge Note   Patient Details  Name: Nancy Conrad MRN: [id number] Date of Birth: October 14, 1931  Transition of [facility] CM/SW Contact:  Lennart Pall, LCSW Phone Number: 03/15/2020, 12:11 PM   Clinical Narrative:    Met with pt today to review dc needs.  Anticipate dc today/tomorrow.  Plan for HEP.  Pt does need rw and 3n1 commode - ordered via [facility] for delivery to room today.  No further TOC needs.   Final next level of care: Home/Self Care Barriers to Discharge: No Barriers Identified   Patient Goals and CMS Choice Patient states their goals for this hospitalization and ongoing recovery are:: go home      Discharge Placement                       Discharge Plan and Services                DME Arranged: 3-N-1, Walker rolling DME Agency: [employer] Date DME Agency Contacted: 03/15/20 Time DME Agency Contacted: [id number] Representative spoke with at DME Agency: [place] ([place] Interventions     Readmission Risk Interventions No flowsheet data found.

## 2020-03-15 NOTE — Evaluation (Signed)
Physical Therapy Evaluation Patient Details Name: Nancy Conrad MRN: [id number] DOB: 10-05-1931 Today's Date: 03/15/2020   History of Present Illness  s/p  L DA THA  Clinical Impression  Pt is s/p THA resulting in the deficits listed below (see PT Problem List).  Pt amb ~ 12 ' in room with RW and min assist. Distance limited by therapist d/t incr pain. Will continue to follow in acute setting. Anticipate steady progress. Pt plans [place] return [place] [facility], with dtr assisting   Pt will benefit from skilled PT to increase their independence and safety with mobility to allow discharge to the venue listed below.      Follow Up Recommendations Follow surgeon's recommendation for DC plan and follow-up therapies (HEP)    Equipment Recommendations  Other (comment) (delivered)    Recommendations for Other Services       Precautions / Restrictions Precautions Precautions: Fall Restrictions Weight Bearing Restrictions: No Other Position/Activity Restrictions: WBAT      Mobility  Bed Mobility Overal bed mobility: Needs Assistance Bed Mobility: Supine to Sit     Supine to sit: Min assist     General bed mobility comments: assist with LLE    Transfers Overall transfer level: Needs assistance Equipment used: Rolling walker (2 wheeled) Transfers: Sit to/from Stand Sit to Stand: Min assist         General transfer comment: cues for hand placement, light assist to rise and transition to RW  Ambulation/Gait Ambulation/Gait assistance: Min assist Gait Distance (Feet): 12 Feet (in room) Assistive device: Rolling walker (2 wheeled) Gait Pattern/deviations: Step-to pattern;Decreased stance time - left     General Gait Details: cues for sequence and RW position  Stairs            Wheelchair Mobility    Modified Rankin (Stroke Patients Only)       Balance                                             Pertinent Vitals/Pain Pain  Assessment: 0-10 Pain Score: 4  Pain Location: left  hip, gluts Pain Descriptors / Indicators: Discomfort;Grimacing;Sore Pain Intervention(s): Limited activity within patient's tolerance;Monitored during session;Repositioned;Premedicated before session    Home Living Family/patient expects to be discharged to:: Private residence Living Arrangements: Alone;Children (dtr assisting after surgery) Available Help at Discharge: Family Type of Home: Independent living facility Home Access: Level entry     Home Layout: One level Home Equipment: Cane - single point      Prior Function Level of Independence: Independent;Independent with assistive device(s)         Comments: amb with cane prior to surgery     Hand Dominance        Extremity/Trunk Assessment   Upper Extremity Assessment Upper Extremity Assessment: Overall WFL for tasks assessed    Lower Extremity Assessment Lower Extremity Assessment: LLE deficits/detail       Communication   Communication: No difficulties  Cognition Arousal/Alertness: Awake/alert Behavior During Therapy: WFL for tasks assessed/performed Overall Cognitive Status: Within Functional Limits for tasks assessed                                        General Comments      Exercises Total Joint Exercises Ankle  Circles/Pumps: AROM;Both;10 reps Quad Sets: AROM;Both;5 reps   Assessment/Plan    PT Assessment Patient needs continued PT services  PT Problem List Decreased strength;Decreased mobility;Decreased range of motion;Decreased activity tolerance;Decreased balance;Decreased knowledge of use of DME;Pain       PT Treatment Interventions DME instruction;Therapeutic activities;Gait training;Functional mobility training;Therapeutic exercise;Patient/family education    PT Goals (Current goals can be found in the Care Plan section)  Acute Rehab PT Goals Patient Stated Goal: home Thrursday! PT Goal Formulation: With  patient Time For Goal Achievement: 03/22/20 Potential to Achieve Goals: Good    Frequency 7X/week   Barriers to discharge        Co-evaluation               AM-PAC PT "6 Clicks" Mobility  Outcome Measure Help needed turning from your back to your side while in a flat bed without using bedrails?: A Little Help needed moving from lying on your back to sitting on the side of a flat bed without using bedrails?: A Little Help needed moving to and from a bed to a chair (including a wheelchair)?: A Little Help needed standing up from a chair using your arms (e.g., wheelchair or bedside chair)?: A Little Help needed to walk in hospital room?: A Little Help needed climbing 3-5 steps with a railing? : A Lot 6 Click Score: 17    End of Session Equipment Utilized During Treatment: Gait belt Activity Tolerance: Patient tolerated treatment well Patient left: in chair;with call bell/phone within reach;with chair alarm set Nurse Communication: Mobility status PT Visit Diagnosis: Other abnormalities of gait and mobility (R26.89);Difficulty in walking, not elsewhere classified (R26.2)    Time: 6440-3474 PT Time Calculation (min) (ACUTE ONLY): 38 min   Charges:   PT Evaluation $PT Eval Low Complexity: 1 Low PT Treatments $Gait Training: 8-22 mins        Delice Bison, PT  Acute Rehab Dept ([facility]) [phone] Pager [phone]  03/15/2020   [facility] 03/15/2020, 3:35 PM

## 2020-03-16 DIAGNOSIS — M1612 Unilateral primary osteoarthritis, left hip: Secondary | ICD-10-CM | POA: Diagnosis not present

## 2020-03-16 LAB — CBC
HCT: 29.9 % — ABNORMAL LOW (ref 36.0–46.0)
Hemoglobin: 9.6 g/dL — ABNORMAL LOW (ref 12.0–15.0)
MCH: 31.7 pg (ref 26.0–34.0)
MCHC: 32.1 g/dL (ref 30.0–36.0)
MCV: 98.7 fL (ref 80.0–100.0)
Platelets: 153 10*3/uL (ref 150–400)
RBC: 3.03 MIL/uL — ABNORMAL LOW (ref 3.87–5.11)
RDW: 13 % (ref 11.5–15.5)
WBC: 12.5 10*3/uL — ABNORMAL HIGH (ref 4.0–10.5)
nRBC: 0 % (ref 0.0–0.2)

## 2020-03-16 LAB — BASIC METABOLIC PANEL
Anion gap: 8 (ref 5–15)
BUN: 16 mg/dL (ref 8–23)
CO2: 24 mmol/L (ref 22–32)
Calcium: 8.6 mg/dL — ABNORMAL LOW (ref 8.9–10.3)
Chloride: 104 mmol/L (ref 98–111)
Creatinine, Ser: 0.55 mg/dL (ref 0.44–1.00)
GFR, Estimated: >60 mL/min (ref >60)
Glucose, Bld: 142 mg/dL — ABNORMAL HIGH (ref 70–99)
Potassium: 4.2 mmol/L (ref 3.5–5.1)
Sodium: 136 mmol/L (ref 135–145)

## 2020-03-16 MED ORDER — METHOCARBAMOL 500 MG PO TABS: 500.0000 mg | ORAL_TABLET | Freq: Four times a day (QID) | ORAL | 0 refills | Status: DC | PRN

## 2020-03-16 MED ORDER — TRAMADOL HCL 50 MG PO TABS
50.0000 mg | ORAL_TABLET | Freq: Four times a day (QID) | ORAL | 0 refills | Status: DC | PRN
Start: 2020-03-16 — End: 2022-03-22

## 2020-03-16 MED ORDER — HYDROCODONE-ACETAMINOPHEN 5-325 MG PO TABS
1.0000 | ORAL_TABLET | Freq: Four times a day (QID) | ORAL | 0 refills | Status: DC | PRN
Start: 2020-03-16 — End: 2022-05-23

## 2020-03-16 MED ORDER — SODIUM CHLORIDE 0.9 % IV BOLUS
250.0000 mL | Freq: Once | INTRAVENOUS | Status: AC
  Administered 2020-03-16: 250 mL via INTRAVENOUS

## 2020-03-16 MED ORDER — ASPIRIN 325 MG PO TBEC: 325.0000 mg | DELAYED_RELEASE_TABLET | Freq: Two times a day (BID) | ORAL | 0 refills | Status: AC

## 2020-03-16 NOTE — Progress Notes (Signed)
Physical Therapy Treatment Patient Details Name: Nancy Conrad MRN: [id number] DOB: [birth year] Today's Date: 03/16/2020    History of Present Illness s/p  L DA THA    PT Comments    Pt continues very motivated and progressing steadily with mobility.  Pt's dtr present to review bed mobility, car transfers, lower body dressing, stairs and HEP - written instruction provided and reviewed.  Pt and dtr feel comfortable with ability to follow through at home.  Follow Up Recommendations  Follow surgeon's recommendation for DC plan and follow-up therapies     Equipment Recommendations       Recommendations for Other Services       Precautions / Restrictions Precautions Precautions: Fall Restrictions Weight Bearing Restrictions: No LLE Weight Bearing: Weight bearing as tolerated    Mobility  Bed Mobility Overal bed mobility: Needs Assistance Bed Mobility: Supine to Sit;Sit to Supine     Supine to sit: Min assist Sit to supine: Min assist   General bed mobility comments: assist with LLE and to bring trunk to upright  Transfers Overall transfer level: Needs assistance Equipment used: Rolling walker (2 wheeled) Transfers: Sit to/from Stand Sit to Stand: Min guard;Supervision         General transfer comment: cues for LE management and use of UEs to self assist  Ambulation/Gait Ambulation/Gait assistance: Min guard;Supervision Gait Distance (Feet): 40 Feet Assistive device: Rolling walker (2 wheeled) Gait Pattern/deviations: Step-to pattern;Decreased stance time - left;Shuffle;Trunk flexed Gait velocity: decr   General Gait Details: cues for posture, sequence and position from RW   Stairs Stairs: Yes Stairs assistance: Min assist Stair Management: No rails;Step to pattern;Forwards;With walker Number of Stairs: 2 General stair comments: single step twice with cues for sequence and foot/RW placement   Wheelchair Mobility    Modified Rankin (Stroke Patients  Only)       Balance Overall balance assessment: Mild deficits observed, not formally tested                                          Cognition Arousal/Alertness: Awake/alert Behavior During Therapy: WFL for tasks assessed/performed Overall Cognitive Status: Within Functional Limits for tasks assessed                                        Exercises Total Joint Exercises Ankle Circles/Pumps: AROM;Both;Supine;10 reps Quad Sets: AROM;Both;Supine;5 reps Heel Slides: AAROM;Left;Supine;10 reps Hip ABduction/ADduction: 5 reps;AAROM;Left;Supine Long Arc Quad: AAROM;AROM;Left;10 reps;Seated    General Comments        Pertinent Vitals/Pain Pain Assessment: 0-10 Pain Score: 4  Pain Location: left  hip, gluts Pain Descriptors / Indicators: Aching;Sore Pain Intervention(s): Limited activity within patient's tolerance;Monitored during session;Premedicated before session    Home Living                      Prior Function            PT Goals (current goals can now be found in the care plan section) Acute Rehab PT Goals Patient Stated Goal: home PT Goal Formulation: With patient Time For Goal Achievement: 03/22/20 Potential to Achieve Goals: Good Progress towards PT goals: Progressing toward goals    Frequency    7X/week      PT Plan Current plan remains appropriate  Co-evaluation              AM-PAC PT "6 Clicks" Mobility   Outcome Measure  Help needed turning from your back to your side while in a flat bed without using bedrails?: A Little Help needed moving from lying on your back to sitting on the side of a flat bed without using bedrails?: A Little Help needed moving to and from a bed to a chair (including a wheelchair)?: A Little Help needed standing up from a chair using your arms (e.g., wheelchair or bedside chair)?: A Little Help needed to walk in hospital room?: A Little Help needed climbing 3-5 steps with  a railing? : A Little 6 Click Score: 18    End of Session Equipment Utilized During Treatment: Gait belt Activity Tolerance: Patient tolerated treatment well Patient left: in chair;with call bell/phone within reach;with chair alarm set;with family/visitor present Nurse Communication: Mobility status PT Visit Diagnosis: Other abnormalities of gait and mobility (R26.89);Difficulty in walking, not elsewhere classified (R26.2)     Time: 9798-9211 PT Time Calculation (min) (ACUTE ONLY): 45 min  Charges:  $Gait Training: 8-22 mins $Therapeutic Exercise: 8-22 mins $Therapeutic Activity: 8-22 mins                     Mauro Kaufmann PT Acute Rehabilitation Services Pager [phone] Office [phone]    Nancy Conrad 03/16/2020, 2:44 PM

## 2020-03-16 NOTE — Progress Notes (Signed)
    Subjective: 1 Day Post-Op Procedure(s) (LRB): TOTAL HIP ARTHROPLASTY ANTERIOR APPROACH (Left) Patient reports pain as mild.   Plan is to go Home after hospital stay.  Objective: Vital signs in last 24 hours: Temp:  [97.6 F (36.4 C)-99 F (37.2 C)] 99 F (37.2 C) (11/25 0559) Pulse Rate:  [58-160] 69 (11/25 0559) Resp:  [13-21] 20 (11/25 0559) BP: (102-153)/(60-109) 102/60 (11/25 0559) SpO2:  [96 %-100 %] 100 % (11/25 0559)  Intake/Output from previous day:  Intake/Output Summary (Last 24 hours) at 03/16/2020 0818 Last data filed at 03/16/2020 0600 Gross per 24 hour  Intake 3237.53 ml  Output 3450 ml  Net -212.47 ml    Intake/Output this shift: No intake/output data recorded.  Labs: Recent Labs    03/16/20 0315  HGB 9.6*   Recent Labs    03/16/20 0315  WBC 12.5*  RBC 3.03*  HCT 29.9*  PLT 153   Recent Labs    03/16/20 0315  NA 136  K 4.2  CL 104  CO2 24  BUN 16  CREATININE 0.55  GLUCOSE 142*  CALCIUM 8.6*   No results for input(s): LABPT, INR in the last 72 hours.  EXAM General - Patient is Alert, Appropriate and Oriented Extremity - Neurologically intact Neurovascular intact No cellulitis present Compartment soft Dressing - dressing C/D/I Motor Function - intact, moving foot and toes well on exam.   Past Medical History:  Diagnosis Date  . Arthritis   . Dyspnea    with exertion  . History of kidney stones    1962  . SVT (supraventricular tachycardia) (HCC)     Assessment/Plan: 1 Day Post-Op Procedure(s) (LRB): TOTAL HIP ARTHROPLASTY ANTERIOR APPROACH (Left) Principal Problem:   OA (osteoarthritis) of hip Active Problems:   Primary osteoarthritis of left hip   Advance diet Up with therapy  250 ml saline bolus this AM as BP borderline Discharge today as long as she does well with PT DVT Prophylaxis - Aspirin Weight Bearing As Tolerated left Leg   Ollen Gross

## 2020-03-16 NOTE — Progress Notes (Signed)
Physical Therapy Treatment Patient Details Name: Nancy Conrad MRN: [id number] DOB: [birth year] Today's Date: 03/16/2020    History of Present Illness s/p  L DA THA    PT Comments    Steady progress with mobility.  Pt hopeful for dc home this pm.   Follow Up Recommendations  Follow surgeon's recommendation for DC plan and follow-up therapies     Equipment Recommendations  Other (comment)    Recommendations for Other Services       Precautions / Restrictions Precautions Precautions: Fall Restrictions Weight Bearing Restrictions: No LLE Weight Bearing: Weight bearing as tolerated    Mobility  Bed Mobility Overal bed mobility: Needs Assistance Bed Mobility: Supine to Sit     Supine to sit: Min assist     General bed mobility comments: assist with LLE and to bring trunk to upright  Transfers Overall transfer level: Needs assistance Equipment used: Rolling walker (2 wheeled) Transfers: Sit to/from Stand Sit to Stand: Min assist         General transfer comment: cues for hand placement, light assist to rise and transition to RW  Ambulation/Gait Ambulation/Gait assistance: Min assist;Min guard Gait Distance (Feet): 60 Feet Assistive device: Rolling walker (2 wheeled) Gait Pattern/deviations: Step-to pattern;Decreased stance time - left;Shuffle;Trunk flexed Gait velocity: decr   General Gait Details: cues for sequence and RW position   Stairs             Wheelchair Mobility    Modified Rankin (Stroke Patients Only)       Balance Overall balance assessment: Mild deficits observed, not formally tested                                          Cognition Arousal/Alertness: Awake/alert Behavior During Therapy: WFL for tasks assessed/performed Overall Cognitive Status: Within Functional Limits for tasks assessed                                        Exercises Total Joint Exercises Ankle Circles/Pumps:  AROM;Both;20 reps;Supine Quad Sets: AROM;Both;10 reps;Supine Heel Slides: AAROM;Left;20 reps;Supine Hip ABduction/ADduction: AAROM;Left;15 reps;Supine    General Comments        Pertinent Vitals/Pain Pain Assessment: 0-10 Pain Score: 4  Pain Location: left  hip, gluts Pain Descriptors / Indicators: Discomfort;Grimacing;Sore Pain Intervention(s): Limited activity within patient's tolerance;Monitored during session;Premedicated before session;Ice applied    Home Living                      Prior Function            PT Goals (current goals can now be found in the care plan section) Acute Rehab PT Goals Patient Stated Goal: home PT Goal Formulation: With patient Time For Goal Achievement: 03/22/20 Potential to Achieve Goals: Good Progress towards PT goals: Progressing toward goals    Frequency    7X/week      PT Plan Current plan remains appropriate    Co-evaluation              AM-PAC PT "6 Clicks" Mobility   Outcome Measure  Help needed turning from your back to your side while in a flat bed without using bedrails?: A Little Help needed moving from lying on your back to sitting on the side of a flat  bed without using bedrails?: A Little Help needed moving to and from a bed to a chair (including a wheelchair)?: A Little Help needed standing up from a chair using your arms (e.g., wheelchair or bedside chair)?: A Little Help needed to walk in hospital room?: A Little Help needed climbing 3-5 steps with a railing? : A Lot 6 Click Score: 17    End of Session Equipment Utilized During Treatment: Gait belt Activity Tolerance: Patient tolerated treatment well Patient left: in chair;with call bell/phone within reach;with chair alarm set;with family/visitor present Nurse Communication: Mobility status PT Visit Diagnosis: Other abnormalities of gait and mobility (R26.89);Difficulty in walking, not elsewhere classified (R26.2)     Time: 6962-9528 PT  Time Calculation (min) (ACUTE ONLY): 34 min  Charges:  $Gait Training: 8-22 mins $Therapeutic Exercise: 8-22 mins                     Mauro Kaufmann PT Acute Rehabilitation Services Pager [phone] Office [phone]    Kimila Papaleo 03/16/2020, 11:55 AM

## 2020-03-16 NOTE — Progress Notes (Signed)
All discharge instructions were given to Pt and her daughter. Per Pt's daughter pharmacy is closed and pt doesn't have pain medications at home. Writer paged [facility], spoke with PA French Ana and the prescriptions were send over to [facility] [address], [place].

## 2020-03-21 ENCOUNTER — Encounter (HOSPITAL_COMMUNITY): Payer: Self-pay | Admitting: Orthopedic Surgery

## 2020-03-22 NOTE — Discharge Summary (Signed)
Physician Discharge Summary   Patient ID: Nancy Conrad MRN: [id number] DOB/AGE: [birth year] 86 y.o.  Admit date: 03/15/2020 Discharge date: 03/16/2020  Primary Diagnosis: Osteoarthritis of the left hip   Admission Diagnoses:  Past Medical History:  Diagnosis Date  . Arthritis   . Dyspnea    with exertion  . History of kidney stones    1962  . SVT (supraventricular tachycardia) (HCC)    Discharge Diagnoses:   Principal Problem:   OA (osteoarthritis) of hip Active Problems:   Primary osteoarthritis of left hip  Estimated body mass index is 25.51 kg/m as calculated from the following:   Height as of this encounter: 5\' 1"  (1.549 m).   Weight as of this encounter: 61.2 kg.  Procedure:  Procedure(s) (LRB): TOTAL HIP ARTHROPLASTY ANTERIOR APPROACH (Left)   Consults: None  HPI: Nancy Conrad is a 86 y.o. female who has advanced end-stage arthritis of their Left  hip with progressively worsening pain and dysfunction.The patient has failed nonoperative management and presents for total hip arthroplasty.   Laboratory Data: Admission on 03/15/2020, Discharged on 03/16/2020  Component Date Value Ref Range Status  . ABO/RH(D) 03/15/2020    Final                   Value:A POS Performed at [facility], [address] [place], [place]   . WBC 03/16/2020 12.5* 4.0 - 10.5 K/uL Final  . RBC 03/16/2020 3.03* 3.87 - 5.11 MIL/uL Final  . Hemoglobin 03/16/2020 9.6* 12.0 - 15.0 g/dL Final  . HCT 03/18/2020 29.9* 36 - 46 % Final  . MCV 03/16/2020 98.7  80.0 - 100.0 fL Final  . MCH 03/16/2020 31.7  26.0 - 34.0 pg Final  . MCHC 03/16/2020 32.1  30.0 - 36.0 g/dL Final  . RDW 03/18/2020 13.0  11.5 - 15.5 % Final  . Platelets 03/16/2020 153  150 - 400 K/uL Final  . nRBC 03/16/2020 0.0  0.0 - 0.2 % Final   Performed at [facility], [address], [place], [place]  . Sodium 03/16/2020 136  135 - 145 mmol/L Final  . Potassium  03/16/2020 4.2  3.5 - 5.1 mmol/L Final  . Chloride 03/16/2020 104  98 - 111 mmol/L Final  . CO2 03/16/2020 24  22 - 32 mmol/L Final  . Glucose, Bld 03/16/2020 142* 70 - 99 mg/dL Final   Glucose reference range applies only to samples taken after fasting for at least 8 hours.  . BUN 03/16/2020 16  8 - 23 mg/dL Final  . Creatinine, Ser 03/16/2020 0.55  0.44 - 1.00 mg/dL Final  . Calcium 03/18/2020 8.6* 8.9 - 10.3 mg/dL Final  . GFR, Estimated 03/16/2020 >60  >60 mL/min Final   Comment: (NOTE) Calculated using the CKD-EPI Creatinine Equation (2021)   . Anion gap 03/16/2020 8  5 - 15 Final   Performed at [facility], [address], [place], [place]  Hospital Outpatient Visit on 03/11/2020  Component Date Value Ref Range Status  . SARS Coronavirus 2 03/11/2020 NEGATIVE  NEGATIVE Final   Comment: (NOTE) SARS-CoV-2 target nucleic acids are NOT DETECTED.  The SARS-CoV-2 RNA is generally detectable in upper and lower respiratory specimens during the acute phase of infection. Negative results do not preclude SARS-CoV-2 infection, do not rule out co-infections with other pathogens, and should not be used as the sole basis for treatment or other patient management decisions. Negative results must  be combined with clinical observations, patient history, and epidemiological information. The expected result is Negative.  Fact Sheet for Patients: HairSlick.no  Fact Sheet for Healthcare Providers: quierodirigir.com  This test is not yet approved or cleared by the [place] FDA and  has been authorized for detection and/or diagnosis of SARS-CoV-2 by FDA under an Emergency Use Authorization (EUA). This EUA will remain  in effect (meaning this test can be used) for the duration of the COVID-19 declaration under Se                          ction 564(b)(1) of the Act, 21 U.S.C. section 360bbb-3(b)(1), unless  the authorization is terminated or revoked sooner.  Performed at [facility], [address], [place], [place] [address]   Hospital Outpatient Visit on 03/09/2020  Component Date Value Ref Range Status  . MRSA, PCR 03/09/2020 NEGATIVE  NEGATIVE Final  . Staphylococcus aureus 03/09/2020 POSITIVE* NEGATIVE Final   Comment: (NOTE) The Xpert SA Assay (FDA approved for NASAL specimens in patients 70 years of age and older), is one component of a comprehensive surveillance program. It is not intended to diagnose infection nor to guide or monitor treatment. Performed at [facility], [address] [place], [place] [address]   . aPTT 03/09/2020 27  24 - 36 seconds Final   Performed at [facility], [address], [place], [place] [address]  . WBC 03/09/2020 5.7  4.0 - 10.5 K/uL Final  . RBC 03/09/2020 4.10  3.87 - 5.11 MIL/uL Final  . Hemoglobin 03/09/2020 12.7  12.0 - 15.0 g/dL Final  . HCT 02/16/2535 40.3  36 - 46 % Final  . MCV 03/09/2020 98.3  80.0 - 100.0 fL Final  . MCH 03/09/2020 31.0  26.0 - 34.0 pg Final  . MCHC 03/09/2020 31.5  30.0 - 36.0 g/dL Final  . RDW 64/40/3474 13.0  11.5 - 15.5 % Final  . Platelets 03/09/2020 241  150 - 400 K/uL Final  . nRBC 03/09/2020 0.0  0.0 - 0.2 % Final   Performed at [facility], [address], [place], [place] [address]  . Sodium 03/09/2020 137  135 - 145 mmol/L Final  . Potassium 03/09/2020 5.0  3.5 - 5.1 mmol/L Final  . Chloride 03/09/2020 98  98 - 111 mmol/L Final  . CO2 03/09/2020 27  22 - 32 mmol/L Final  . Glucose, Bld 03/09/2020 93  70 - 99 mg/dL Final   Glucose reference range applies only to samples taken after fasting for at least 8 hours.  . BUN 03/09/2020 20  8 - 23 mg/dL Final  . Creatinine, Ser 03/09/2020 0.64  0.44 - 1.00 mg/dL Final  . Calcium 38/75/6433 9.9  8.9 - 10.3 mg/dL Final  . Total Protein 03/09/2020 7.5  6.5 - 8.1 g/dL Final  . Albumin 29/51/8841  4.5  3.5 - 5.0 g/dL Final  . AST 66/09/3014 19  15 - 41 U/L Final  . ALT 03/09/2020 14  0 - 44 U/L Final  . Alkaline Phosphatase 03/09/2020 60  38 - 126 U/L Final  . Total Bilirubin 03/09/2020 0.6  0.3 - 1.2 mg/dL Final  . GFR, Estimated 03/09/2020 >60  >60 mL/min Final   Comment: (NOTE) Calculated using the CKD-EPI Creatinine Equation (2021)   . Anion gap 03/09/2020 12  5 - 15 Final   Performed at [facility], [address]  [place], [place] [address]  . Prothrombin Time 03/09/2020 12.8  11.4 - 15.2 seconds Final  . INR 03/09/2020 1.0  0.8 - 1.2 Final   Comment: (NOTE) INR goal varies based on device and disease states. Performed at [facility], [address] [place], [place] [address]   . ABO/RH(D) 03/09/2020 A POS   Final  . Antibody Screen 03/09/2020 NEG   Final  . Sample Expiration 03/09/2020 03/18/2020,2359   Final  . Extend sample reason 03/09/2020    Final                   Value:NO TRANSFUSIONS OR PREGNANCY IN THE PAST 3 MONTHS Performed at [facility], [address], [place], [place] [address]      X-Rays:DG Pelvis Portable  Result Date: 03/15/2020 CLINICAL DATA:  Status post left total hip replacement. EXAM: PORTABLE PELVIS 1-2 VIEWS COMPARISON:  None. FINDINGS: The left femoral and acetabular components appear to be well situated. Expected postoperative changes are seen in the surrounding soft tissues. IMPRESSION: Status post left total hip arthroplasty. Electronically Signed   By: Lupita Raider M.D.   On: 03/15/2020 09:40   DG C-Arm 1-60 Min-No Report  Result Date: 03/15/2020 Fluoroscopy was utilized by the requesting physician.  No radiographic interpretation.   DG HIP OPERATIVE UNILAT W OR W/O PELVIS LEFT  Result Date: 03/15/2020 CLINICAL DATA:  Total hip replacement EXAM: OPERATIVE LEFT HIP   1 VIEW TECHNIQUE: Fluoroscopic spot image(s) were submitted for interpretation post-operatively.  COMPARISON:  None. FINDINGS: Frontal views obtained. Initial images show severe osteoarthritic change in the left hip joint. No fracture or dislocation. Total hip replacements subsequently performed on the left with prosthetic components on the left well-seated on frontal view. No dislocation. Slight narrowing right hip joint. IMPRESSION: Total hip replacement on the left with prosthetic components well-seated on frontal view. No acute fracture or dislocation. Mild narrowing right hip joint. Electronically Signed   By: Bretta Bang III M.D.   On: 03/15/2020 09:01    EKG: Orders placed or performed during the hospital encounter of 03/15/20  . EKG 12-Lead  . EKG 12-Lead  . EKG 12-Lead  . EKG 12-Lead     Hospital Course: Nancy Conrad is a 86 y.o. who was admitted to [facility]. They were brought to the operating room on 03/15/2020 and underwent Procedure(s): TOTAL HIP ARTHROPLASTY ANTERIOR APPROACH.  Patient tolerated the procedure well and was later transferred to the recovery room and then to the orthopaedic floor for postoperative care. They were given PO and IV analgesics for pain control following their surgery. They were given 24 hours of postoperative antibiotics of  Anti-infectives (From admission, onward)   Start     Dose/Rate Route Frequency Ordered Stop   03/15/20 1330  ceFAZolin (ANCEF) IVPB 2g/100 mL premix        2 g 200 mL/hr over 30 Minutes Intravenous Every 6 hours 03/15/20 1019 03/15/20 2015   03/15/20 0600  ceFAZolin (ANCEF) IVPB 2g/100 mL premix        2 g 200 mL/hr over 30 Minutes Intravenous On call to O.R. 03/15/20 9147 03/15/20 0721     and started on DVT prophylaxis in the form of Aspirin.   PT and OT were ordered for total joint protocol. Discharge planning consulted to help with postop disposition and equipment needs.  Patient had a decent night on the evening of surgery. They started to get up OOB with therapy  on POD #0. Pt was seen during rounds and  was ready to go home pending progress with therapy. Bolus given for soft BP. She worked with therapy on POD #1 and was meeting her goals. Pt was discharged to home later that day in stable condition.  Diet: Regular diet Activity: WBAT Follow-up: in 2 weeks Disposition: Home with HEP Discharged Condition: stable   Discharge Instructions    Call MD / Call 911   Complete by: As directed    If you experience chest pain or shortness of breath, CALL 911 and be transported to the hospital emergency room.  If you develope a fever above 101 F, pus (white drainage) or increased drainage or redness at the wound, or calf pain, call your surgeon's office.   Change dressing   Complete by: As directed    You have an adhesive waterproof bandage over the incision. Leave this in place until your first follow-up appointment. Once you remove this you will not need to place another bandage.   Constipation Prevention   Complete by: As directed    Drink plenty of fluids.  Prune juice may be helpful.  You may use a stool softener, such as Colace (over the counter) 100 mg twice a day.  Use MiraLax (over the counter) for constipation as needed.   Diet - low sodium heart healthy   Complete by: As directed    Do not sit on low chairs, stoools or toilet seats, as it may be difficult to get up from low surfaces   Complete by: As directed    Driving restrictions   Complete by: As directed    No driving for two weeks   TED hose   Complete by: As directed    Use stockings (TED hose) for three weeks on both leg(s).  You may remove them at night for sleeping.   Weight bearing as tolerated   Complete by: As directed      Allergies as of 03/16/2020   No Known Allergies     Medication List    STOP taking these medications   naproxen sodium 220 MG tablet Commonly known as: ALEVE     TAKE these medications   acetaminophen 500 MG tablet Commonly known as: TYLENOL Take 1,000 mg by mouth every 6 (six) hours as  needed for mild pain.   aspirin 325 MG EC tablet Take 1 tablet (325 mg total) by mouth 2 (two) times daily for 20 days. Then take one 81 mg aspirin once a day for three weeks. Then discontinue aspirin.   calcitonin (salmon) 200 UNIT/ACT nasal spray Commonly known as: MIACALCIN/FORTICAL Place 1 spray into the nose daily.   calcium carbonate 600 MG tablet Commonly known as: OS-CAL Take 600 mg by mouth daily.   Glucosamine Sulfate 1000 MG Caps Take 1,000 mg by mouth in the morning and at bedtime.   HYDROcodone-acetaminophen 5-325 MG tablet Commonly known as: NORCO/VICODIN Take 1-2 tablets by mouth every 6 (six) hours as needed for severe pain.   Lutein-Zeaxanthin 25-5 MG Caps Take 1 capsule by mouth daily.   methocarbamol 500 MG tablet Commonly known as: ROBAXIN Take 1 tablet (500 mg total) by mouth every 6 (six) hours as needed for muscle spasms.   metoprolol tartrate 25 MG tablet Commonly known as: LOPRESSOR TAKE 1 TABLET TWICE DAILY.   traMADol 50 MG tablet Commonly known as: ULTRAM Take 1-2 tablets (50-100 mg total) by mouth every 6 (six) hours as needed for moderate pain.  VITAMIN B COMPLEX PO Take 1 tablet by mouth every Monday, Wednesday, and Friday.   Vitamin D 50 MCG (2000 UT) tablet Take 2,000 Units by mouth daily.            Discharge Care Instructions  (From admission, onward)         Start     Ordered   03/15/20 0000  Weight bearing as tolerated        03/15/20 1230   03/15/20 0000  Change dressing       Comments: You have an adhesive waterproof bandage over the incision. Leave this in place until your first follow-up appointment. Once you remove this you will not need to place another bandage.   03/15/20 1230          Follow-up Information    Ollen Gross, MD. Go on 03/28/2020.   Specialty: Orthopedic Surgery Why: You are scheduled for first post op on Tuesday December 7th at 4:30pm. Contact information: [address] [place]  [address] [place] [address] [phone]               Signed: Arther Abbott, PA-C Orthopedic Surgery 03/22/2020, 10:38 AM

## 2020-03-28 DIAGNOSIS — M1712 Unilateral primary osteoarthritis, left knee: Secondary | ICD-10-CM | POA: Diagnosis not present

## 2020-03-28 DIAGNOSIS — M25562 Pain in left knee: Secondary | ICD-10-CM | POA: Diagnosis not present

## 2020-03-28 DIAGNOSIS — Z96642 Presence of left artificial hip joint: Secondary | ICD-10-CM | POA: Diagnosis not present

## 2020-04-11 DIAGNOSIS — Z96642 Presence of left artificial hip joint: Secondary | ICD-10-CM | POA: Diagnosis not present

## 2020-04-20 DIAGNOSIS — M1990 Unspecified osteoarthritis, unspecified site: Secondary | ICD-10-CM | POA: Diagnosis not present

## 2020-04-20 DIAGNOSIS — Z471 Aftercare following joint replacement surgery: Secondary | ICD-10-CM | POA: Diagnosis not present

## 2020-04-20 DIAGNOSIS — R2681 Unsteadiness on feet: Secondary | ICD-10-CM | POA: Diagnosis not present

## 2020-04-20 DIAGNOSIS — M6281 Muscle weakness (generalized): Secondary | ICD-10-CM | POA: Diagnosis not present

## 2020-04-26 DIAGNOSIS — M1990 Unspecified osteoarthritis, unspecified site: Secondary | ICD-10-CM | POA: Diagnosis not present

## 2020-04-26 DIAGNOSIS — Z471 Aftercare following joint replacement surgery: Secondary | ICD-10-CM | POA: Diagnosis not present

## 2020-04-26 DIAGNOSIS — M6281 Muscle weakness (generalized): Secondary | ICD-10-CM | POA: Diagnosis not present

## 2020-04-26 DIAGNOSIS — R2681 Unsteadiness on feet: Secondary | ICD-10-CM | POA: Diagnosis not present

## 2020-04-28 DIAGNOSIS — R2681 Unsteadiness on feet: Secondary | ICD-10-CM | POA: Diagnosis not present

## 2020-04-28 DIAGNOSIS — Z471 Aftercare following joint replacement surgery: Secondary | ICD-10-CM | POA: Diagnosis not present

## 2020-04-28 DIAGNOSIS — M1990 Unspecified osteoarthritis, unspecified site: Secondary | ICD-10-CM | POA: Diagnosis not present

## 2020-04-28 DIAGNOSIS — M6281 Muscle weakness (generalized): Secondary | ICD-10-CM | POA: Diagnosis not present

## 2020-05-01 DIAGNOSIS — Z471 Aftercare following joint replacement surgery: Secondary | ICD-10-CM | POA: Diagnosis not present

## 2020-05-01 DIAGNOSIS — M1990 Unspecified osteoarthritis, unspecified site: Secondary | ICD-10-CM | POA: Diagnosis not present

## 2020-05-01 DIAGNOSIS — R2681 Unsteadiness on feet: Secondary | ICD-10-CM | POA: Diagnosis not present

## 2020-05-01 DIAGNOSIS — M6281 Muscle weakness (generalized): Secondary | ICD-10-CM | POA: Diagnosis not present

## 2020-05-03 DIAGNOSIS — M1990 Unspecified osteoarthritis, unspecified site: Secondary | ICD-10-CM | POA: Diagnosis not present

## 2020-05-03 DIAGNOSIS — R2681 Unsteadiness on feet: Secondary | ICD-10-CM | POA: Diagnosis not present

## 2020-05-03 DIAGNOSIS — M6281 Muscle weakness (generalized): Secondary | ICD-10-CM | POA: Diagnosis not present

## 2020-05-03 DIAGNOSIS — Z471 Aftercare following joint replacement surgery: Secondary | ICD-10-CM | POA: Diagnosis not present

## 2020-05-05 DIAGNOSIS — M1990 Unspecified osteoarthritis, unspecified site: Secondary | ICD-10-CM | POA: Diagnosis not present

## 2020-05-05 DIAGNOSIS — M6281 Muscle weakness (generalized): Secondary | ICD-10-CM | POA: Diagnosis not present

## 2020-05-05 DIAGNOSIS — Z471 Aftercare following joint replacement surgery: Secondary | ICD-10-CM | POA: Diagnosis not present

## 2020-05-05 DIAGNOSIS — R2681 Unsteadiness on feet: Secondary | ICD-10-CM | POA: Diagnosis not present

## 2020-05-06 DIAGNOSIS — M1990 Unspecified osteoarthritis, unspecified site: Secondary | ICD-10-CM | POA: Diagnosis not present

## 2020-05-06 DIAGNOSIS — Z471 Aftercare following joint replacement surgery: Secondary | ICD-10-CM | POA: Diagnosis not present

## 2020-05-06 DIAGNOSIS — R2681 Unsteadiness on feet: Secondary | ICD-10-CM | POA: Diagnosis not present

## 2020-05-06 DIAGNOSIS — M6281 Muscle weakness (generalized): Secondary | ICD-10-CM | POA: Diagnosis not present

## 2020-05-10 DIAGNOSIS — M6281 Muscle weakness (generalized): Secondary | ICD-10-CM | POA: Diagnosis not present

## 2020-05-10 DIAGNOSIS — M1990 Unspecified osteoarthritis, unspecified site: Secondary | ICD-10-CM | POA: Diagnosis not present

## 2020-05-10 DIAGNOSIS — R2681 Unsteadiness on feet: Secondary | ICD-10-CM | POA: Diagnosis not present

## 2020-05-10 DIAGNOSIS — Z471 Aftercare following joint replacement surgery: Secondary | ICD-10-CM | POA: Diagnosis not present

## 2020-05-12 DIAGNOSIS — Z471 Aftercare following joint replacement surgery: Secondary | ICD-10-CM | POA: Diagnosis not present

## 2020-05-12 DIAGNOSIS — M6281 Muscle weakness (generalized): Secondary | ICD-10-CM | POA: Diagnosis not present

## 2020-05-12 DIAGNOSIS — M1990 Unspecified osteoarthritis, unspecified site: Secondary | ICD-10-CM | POA: Diagnosis not present

## 2020-05-12 DIAGNOSIS — R2681 Unsteadiness on feet: Secondary | ICD-10-CM | POA: Diagnosis not present

## 2020-05-15 DIAGNOSIS — R2681 Unsteadiness on feet: Secondary | ICD-10-CM | POA: Diagnosis not present

## 2020-05-15 DIAGNOSIS — M1990 Unspecified osteoarthritis, unspecified site: Secondary | ICD-10-CM | POA: Diagnosis not present

## 2020-05-15 DIAGNOSIS — M6281 Muscle weakness (generalized): Secondary | ICD-10-CM | POA: Diagnosis not present

## 2020-05-15 DIAGNOSIS — Z471 Aftercare following joint replacement surgery: Secondary | ICD-10-CM | POA: Diagnosis not present

## 2020-05-17 DIAGNOSIS — M6281 Muscle weakness (generalized): Secondary | ICD-10-CM | POA: Diagnosis not present

## 2020-05-17 DIAGNOSIS — Z471 Aftercare following joint replacement surgery: Secondary | ICD-10-CM | POA: Diagnosis not present

## 2020-05-17 DIAGNOSIS — M1990 Unspecified osteoarthritis, unspecified site: Secondary | ICD-10-CM | POA: Diagnosis not present

## 2020-05-17 DIAGNOSIS — R2681 Unsteadiness on feet: Secondary | ICD-10-CM | POA: Diagnosis not present

## 2020-05-19 DIAGNOSIS — M6281 Muscle weakness (generalized): Secondary | ICD-10-CM | POA: Diagnosis not present

## 2020-05-19 DIAGNOSIS — Z471 Aftercare following joint replacement surgery: Secondary | ICD-10-CM | POA: Diagnosis not present

## 2020-05-19 DIAGNOSIS — M1990 Unspecified osteoarthritis, unspecified site: Secondary | ICD-10-CM | POA: Diagnosis not present

## 2020-05-19 DIAGNOSIS — R2681 Unsteadiness on feet: Secondary | ICD-10-CM | POA: Diagnosis not present

## 2020-05-22 DIAGNOSIS — M1990 Unspecified osteoarthritis, unspecified site: Secondary | ICD-10-CM | POA: Diagnosis not present

## 2020-05-22 DIAGNOSIS — M6281 Muscle weakness (generalized): Secondary | ICD-10-CM | POA: Diagnosis not present

## 2020-05-22 DIAGNOSIS — Z471 Aftercare following joint replacement surgery: Secondary | ICD-10-CM | POA: Diagnosis not present

## 2020-05-22 DIAGNOSIS — R2681 Unsteadiness on feet: Secondary | ICD-10-CM | POA: Diagnosis not present

## 2020-08-25 DIAGNOSIS — Z79899 Other long term (current) drug therapy: Secondary | ICD-10-CM | POA: Diagnosis not present

## 2020-08-25 DIAGNOSIS — E7800 Pure hypercholesterolemia, unspecified: Secondary | ICD-10-CM | POA: Diagnosis not present

## 2020-08-25 DIAGNOSIS — M810 Age-related osteoporosis without current pathological fracture: Secondary | ICD-10-CM | POA: Diagnosis not present

## 2020-08-25 DIAGNOSIS — E559 Vitamin D deficiency, unspecified: Secondary | ICD-10-CM | POA: Diagnosis not present

## 2020-09-04 DIAGNOSIS — Z1389 Encounter for screening for other disorder: Secondary | ICD-10-CM | POA: Diagnosis not present

## 2020-09-04 DIAGNOSIS — Z0000 Encounter for general adult medical examination without abnormal findings: Secondary | ICD-10-CM | POA: Diagnosis not present

## 2020-09-04 DIAGNOSIS — Z23 Encounter for immunization: Secondary | ICD-10-CM | POA: Diagnosis not present

## 2020-09-05 DIAGNOSIS — Z961 Presence of intraocular lens: Secondary | ICD-10-CM | POA: Diagnosis not present

## 2020-09-05 DIAGNOSIS — H353131 Nonexudative age-related macular degeneration, bilateral, early dry stage: Secondary | ICD-10-CM | POA: Diagnosis not present

## 2020-09-05 DIAGNOSIS — H43813 Vitreous degeneration, bilateral: Secondary | ICD-10-CM | POA: Diagnosis not present

## 2020-09-05 DIAGNOSIS — H52203 Unspecified astigmatism, bilateral: Secondary | ICD-10-CM | POA: Diagnosis not present

## 2020-09-07 DIAGNOSIS — M810 Age-related osteoporosis without current pathological fracture: Secondary | ICD-10-CM | POA: Diagnosis not present

## 2020-09-07 DIAGNOSIS — Z79899 Other long term (current) drug therapy: Secondary | ICD-10-CM | POA: Diagnosis not present

## 2020-09-07 DIAGNOSIS — E7800 Pure hypercholesterolemia, unspecified: Secondary | ICD-10-CM | POA: Diagnosis not present

## 2020-09-07 DIAGNOSIS — I471 Supraventricular tachycardia: Secondary | ICD-10-CM | POA: Diagnosis not present

## 2020-09-07 DIAGNOSIS — E559 Vitamin D deficiency, unspecified: Secondary | ICD-10-CM | POA: Diagnosis not present

## 2020-09-07 DIAGNOSIS — Z0000 Encounter for general adult medical examination without abnormal findings: Secondary | ICD-10-CM | POA: Diagnosis not present

## 2020-10-04 DIAGNOSIS — M461 Sacroiliitis, not elsewhere classified: Secondary | ICD-10-CM | POA: Diagnosis not present

## 2020-10-04 DIAGNOSIS — R109 Unspecified abdominal pain: Secondary | ICD-10-CM | POA: Diagnosis not present

## 2020-11-01 DIAGNOSIS — M25572 Pain in left ankle and joints of left foot: Secondary | ICD-10-CM | POA: Diagnosis not present

## 2020-11-09 DIAGNOSIS — R42 Dizziness and giddiness: Secondary | ICD-10-CM | POA: Diagnosis not present

## 2020-11-09 DIAGNOSIS — M25572 Pain in left ankle and joints of left foot: Secondary | ICD-10-CM | POA: Diagnosis not present

## 2020-11-10 DIAGNOSIS — M25572 Pain in left ankle and joints of left foot: Secondary | ICD-10-CM | POA: Diagnosis not present

## 2020-11-13 ENCOUNTER — Other Ambulatory Visit: Payer: Self-pay | Admitting: Sports Medicine

## 2020-11-13 ENCOUNTER — Ambulatory Visit
Admission: RE | Admit: 2020-11-13 | Discharge: 2020-11-13 | Disposition: A | Discharge status: Home / Self Care | Payer: Medicare PPO | Source: Ambulatory Visit | Attending: Sports Medicine | Admitting: Sports Medicine

## 2020-11-13 ENCOUNTER — Other Ambulatory Visit: Payer: Self-pay

## 2020-11-13 DIAGNOSIS — M7989 Other specified soft tissue disorders: Secondary | ICD-10-CM | POA: Diagnosis not present

## 2020-11-13 DIAGNOSIS — M25572 Pain in left ankle and joints of left foot: Secondary | ICD-10-CM

## 2020-11-13 DIAGNOSIS — M7732 Calcaneal spur, left foot: Secondary | ICD-10-CM | POA: Diagnosis not present

## 2020-12-08 DIAGNOSIS — M25511 Pain in right shoulder: Secondary | ICD-10-CM | POA: Diagnosis not present

## 2020-12-08 DIAGNOSIS — M546 Pain in thoracic spine: Secondary | ICD-10-CM | POA: Diagnosis not present

## 2020-12-19 DIAGNOSIS — M546 Pain in thoracic spine: Secondary | ICD-10-CM | POA: Diagnosis not present

## 2020-12-19 DIAGNOSIS — M25511 Pain in right shoulder: Secondary | ICD-10-CM | POA: Diagnosis not present

## 2020-12-19 DIAGNOSIS — M25512 Pain in left shoulder: Secondary | ICD-10-CM | POA: Diagnosis not present

## 2020-12-20 DIAGNOSIS — M549 Dorsalgia, unspecified: Secondary | ICD-10-CM | POA: Diagnosis not present

## 2020-12-20 DIAGNOSIS — Z8781 Personal history of (healed) traumatic fracture: Secondary | ICD-10-CM | POA: Diagnosis not present

## 2021-01-01 DIAGNOSIS — M4854XA Collapsed vertebra, not elsewhere classified, thoracic region, initial encounter for fracture: Secondary | ICD-10-CM | POA: Diagnosis not present

## 2021-01-01 DIAGNOSIS — M545 Low back pain, unspecified: Secondary | ICD-10-CM | POA: Diagnosis not present

## 2021-01-03 DIAGNOSIS — M546 Pain in thoracic spine: Secondary | ICD-10-CM | POA: Diagnosis not present

## 2021-01-08 DIAGNOSIS — M546 Pain in thoracic spine: Secondary | ICD-10-CM | POA: Diagnosis not present

## 2021-01-09 DIAGNOSIS — I10 Essential (primary) hypertension: Secondary | ICD-10-CM | POA: Diagnosis not present

## 2021-01-09 DIAGNOSIS — R32 Unspecified urinary incontinence: Secondary | ICD-10-CM | POA: Diagnosis not present

## 2021-01-09 DIAGNOSIS — Z791 Long term (current) use of non-steroidal anti-inflammatories (NSAID): Secondary | ICD-10-CM | POA: Diagnosis not present

## 2021-01-09 DIAGNOSIS — H3530 Unspecified macular degeneration: Secondary | ICD-10-CM | POA: Diagnosis not present

## 2021-01-09 DIAGNOSIS — G8929 Other chronic pain: Secondary | ICD-10-CM | POA: Diagnosis not present

## 2021-01-09 DIAGNOSIS — I499 Cardiac arrhythmia, unspecified: Secondary | ICD-10-CM | POA: Diagnosis not present

## 2021-01-09 DIAGNOSIS — M810 Age-related osteoporosis without current pathological fracture: Secondary | ICD-10-CM | POA: Diagnosis not present

## 2021-01-09 DIAGNOSIS — M1990 Unspecified osteoarthritis, unspecified site: Secondary | ICD-10-CM | POA: Diagnosis not present

## 2021-01-09 DIAGNOSIS — Z7722 Contact with and (suspected) exposure to environmental tobacco smoke (acute) (chronic): Secondary | ICD-10-CM | POA: Diagnosis not present

## 2021-01-12 DIAGNOSIS — M546 Pain in thoracic spine: Secondary | ICD-10-CM | POA: Diagnosis not present

## 2021-01-12 DIAGNOSIS — M25511 Pain in right shoulder: Secondary | ICD-10-CM | POA: Diagnosis not present

## 2021-01-12 DIAGNOSIS — M25512 Pain in left shoulder: Secondary | ICD-10-CM | POA: Diagnosis not present

## 2021-01-16 DIAGNOSIS — M25511 Pain in right shoulder: Secondary | ICD-10-CM | POA: Diagnosis not present

## 2021-01-16 DIAGNOSIS — M25512 Pain in left shoulder: Secondary | ICD-10-CM | POA: Diagnosis not present

## 2021-01-16 DIAGNOSIS — M546 Pain in thoracic spine: Secondary | ICD-10-CM | POA: Diagnosis not present

## 2021-01-18 DIAGNOSIS — M546 Pain in thoracic spine: Secondary | ICD-10-CM | POA: Diagnosis not present

## 2021-01-18 DIAGNOSIS — M25512 Pain in left shoulder: Secondary | ICD-10-CM | POA: Diagnosis not present

## 2021-01-18 DIAGNOSIS — M25511 Pain in right shoulder: Secondary | ICD-10-CM | POA: Diagnosis not present

## 2021-01-23 DIAGNOSIS — M25512 Pain in left shoulder: Secondary | ICD-10-CM | POA: Diagnosis not present

## 2021-01-23 DIAGNOSIS — M25511 Pain in right shoulder: Secondary | ICD-10-CM | POA: Diagnosis not present

## 2021-01-23 DIAGNOSIS — M546 Pain in thoracic spine: Secondary | ICD-10-CM | POA: Diagnosis not present

## 2021-01-26 DIAGNOSIS — M546 Pain in thoracic spine: Secondary | ICD-10-CM | POA: Diagnosis not present

## 2021-01-26 DIAGNOSIS — M25512 Pain in left shoulder: Secondary | ICD-10-CM | POA: Diagnosis not present

## 2021-01-26 DIAGNOSIS — M25511 Pain in right shoulder: Secondary | ICD-10-CM | POA: Diagnosis not present

## 2021-01-30 DIAGNOSIS — M25512 Pain in left shoulder: Secondary | ICD-10-CM | POA: Diagnosis not present

## 2021-01-30 DIAGNOSIS — M25511 Pain in right shoulder: Secondary | ICD-10-CM | POA: Diagnosis not present

## 2021-01-30 DIAGNOSIS — M810 Age-related osteoporosis without current pathological fracture: Secondary | ICD-10-CM | POA: Diagnosis not present

## 2021-01-30 DIAGNOSIS — M546 Pain in thoracic spine: Secondary | ICD-10-CM | POA: Diagnosis not present

## 2021-01-30 DIAGNOSIS — I471 Supraventricular tachycardia: Secondary | ICD-10-CM | POA: Diagnosis not present

## 2021-01-30 DIAGNOSIS — Z23 Encounter for immunization: Secondary | ICD-10-CM | POA: Diagnosis not present

## 2021-01-30 DIAGNOSIS — R42 Dizziness and giddiness: Secondary | ICD-10-CM | POA: Diagnosis not present

## 2021-01-30 DIAGNOSIS — Z8781 Personal history of (healed) traumatic fracture: Secondary | ICD-10-CM | POA: Diagnosis not present

## 2021-02-01 DIAGNOSIS — M25511 Pain in right shoulder: Secondary | ICD-10-CM | POA: Diagnosis not present

## 2021-02-01 DIAGNOSIS — M546 Pain in thoracic spine: Secondary | ICD-10-CM | POA: Diagnosis not present

## 2021-02-01 DIAGNOSIS — M25512 Pain in left shoulder: Secondary | ICD-10-CM | POA: Diagnosis not present

## 2021-02-06 DIAGNOSIS — M25511 Pain in right shoulder: Secondary | ICD-10-CM | POA: Diagnosis not present

## 2021-02-06 DIAGNOSIS — M25512 Pain in left shoulder: Secondary | ICD-10-CM | POA: Diagnosis not present

## 2021-02-06 DIAGNOSIS — M546 Pain in thoracic spine: Secondary | ICD-10-CM | POA: Diagnosis not present

## 2021-02-08 DIAGNOSIS — M25511 Pain in right shoulder: Secondary | ICD-10-CM | POA: Diagnosis not present

## 2021-02-08 DIAGNOSIS — M546 Pain in thoracic spine: Secondary | ICD-10-CM | POA: Diagnosis not present

## 2021-02-08 DIAGNOSIS — M25512 Pain in left shoulder: Secondary | ICD-10-CM | POA: Diagnosis not present

## 2021-02-13 DIAGNOSIS — M546 Pain in thoracic spine: Secondary | ICD-10-CM | POA: Diagnosis not present

## 2021-02-13 DIAGNOSIS — M25511 Pain in right shoulder: Secondary | ICD-10-CM | POA: Diagnosis not present

## 2021-02-13 DIAGNOSIS — M25512 Pain in left shoulder: Secondary | ICD-10-CM | POA: Diagnosis not present

## 2021-02-15 DIAGNOSIS — M25512 Pain in left shoulder: Secondary | ICD-10-CM | POA: Diagnosis not present

## 2021-02-15 DIAGNOSIS — M546 Pain in thoracic spine: Secondary | ICD-10-CM | POA: Diagnosis not present

## 2021-02-15 DIAGNOSIS — M25511 Pain in right shoulder: Secondary | ICD-10-CM | POA: Diagnosis not present

## 2021-03-06 DIAGNOSIS — H353131 Nonexudative age-related macular degeneration, bilateral, early dry stage: Secondary | ICD-10-CM | POA: Diagnosis not present

## 2021-03-27 DIAGNOSIS — H353131 Nonexudative age-related macular degeneration, bilateral, early dry stage: Secondary | ICD-10-CM | POA: Diagnosis not present

## 2021-05-03 DIAGNOSIS — Z961 Presence of intraocular lens: Secondary | ICD-10-CM | POA: Diagnosis not present

## 2021-05-03 DIAGNOSIS — H353131 Nonexudative age-related macular degeneration, bilateral, early dry stage: Secondary | ICD-10-CM | POA: Diagnosis not present

## 2021-05-19 DIAGNOSIS — R32 Unspecified urinary incontinence: Secondary | ICD-10-CM | POA: Diagnosis not present

## 2021-05-19 DIAGNOSIS — I471 Supraventricular tachycardia: Secondary | ICD-10-CM | POA: Diagnosis not present

## 2021-05-19 DIAGNOSIS — I499 Cardiac arrhythmia, unspecified: Secondary | ICD-10-CM | POA: Diagnosis not present

## 2021-05-19 DIAGNOSIS — M1990 Unspecified osteoarthritis, unspecified site: Secondary | ICD-10-CM | POA: Diagnosis not present

## 2021-05-19 DIAGNOSIS — Z820 Family history of epilepsy and other diseases of the nervous system: Secondary | ICD-10-CM | POA: Diagnosis not present

## 2021-05-19 DIAGNOSIS — Z79899 Other long term (current) drug therapy: Secondary | ICD-10-CM | POA: Diagnosis not present

## 2021-05-19 DIAGNOSIS — Z7722 Contact with and (suspected) exposure to environmental tobacco smoke (acute) (chronic): Secondary | ICD-10-CM | POA: Diagnosis not present

## 2021-05-19 DIAGNOSIS — I10 Essential (primary) hypertension: Secondary | ICD-10-CM | POA: Diagnosis not present

## 2021-05-19 DIAGNOSIS — M810 Age-related osteoporosis without current pathological fracture: Secondary | ICD-10-CM | POA: Diagnosis not present

## 2021-09-12 DIAGNOSIS — E7800 Pure hypercholesterolemia, unspecified: Secondary | ICD-10-CM | POA: Diagnosis not present

## 2021-09-12 DIAGNOSIS — E559 Vitamin D deficiency, unspecified: Secondary | ICD-10-CM | POA: Diagnosis not present

## 2021-09-12 DIAGNOSIS — Z79899 Other long term (current) drug therapy: Secondary | ICD-10-CM | POA: Diagnosis not present

## 2021-09-19 DIAGNOSIS — Z8781 Personal history of (healed) traumatic fracture: Secondary | ICD-10-CM | POA: Diagnosis not present

## 2021-09-19 DIAGNOSIS — I471 Supraventricular tachycardia: Secondary | ICD-10-CM | POA: Diagnosis not present

## 2021-09-19 DIAGNOSIS — M810 Age-related osteoporosis without current pathological fracture: Secondary | ICD-10-CM | POA: Diagnosis not present

## 2021-09-19 DIAGNOSIS — R7301 Impaired fasting glucose: Secondary | ICD-10-CM | POA: Diagnosis not present

## 2021-09-19 DIAGNOSIS — Z131 Encounter for screening for diabetes mellitus: Secondary | ICD-10-CM | POA: Diagnosis not present

## 2021-09-19 DIAGNOSIS — Z0000 Encounter for general adult medical examination without abnormal findings: Secondary | ICD-10-CM | POA: Diagnosis not present

## 2021-09-19 DIAGNOSIS — E7800 Pure hypercholesterolemia, unspecified: Secondary | ICD-10-CM | POA: Diagnosis not present

## 2021-10-03 IMAGING — CR DG ANKLE COMPLETE 3+V*L*
3 series · 3 of 3 positions shown · non-contrast
Comparison: 01/18/2015

CLINICAL DATA: Posterior ankle pain and swelling. Heel pain. Pain
is constant but worse with walking.

EXAM:
LEFT ANKLE COMPLETE - 3+ VIEW

[x ankle ap left]
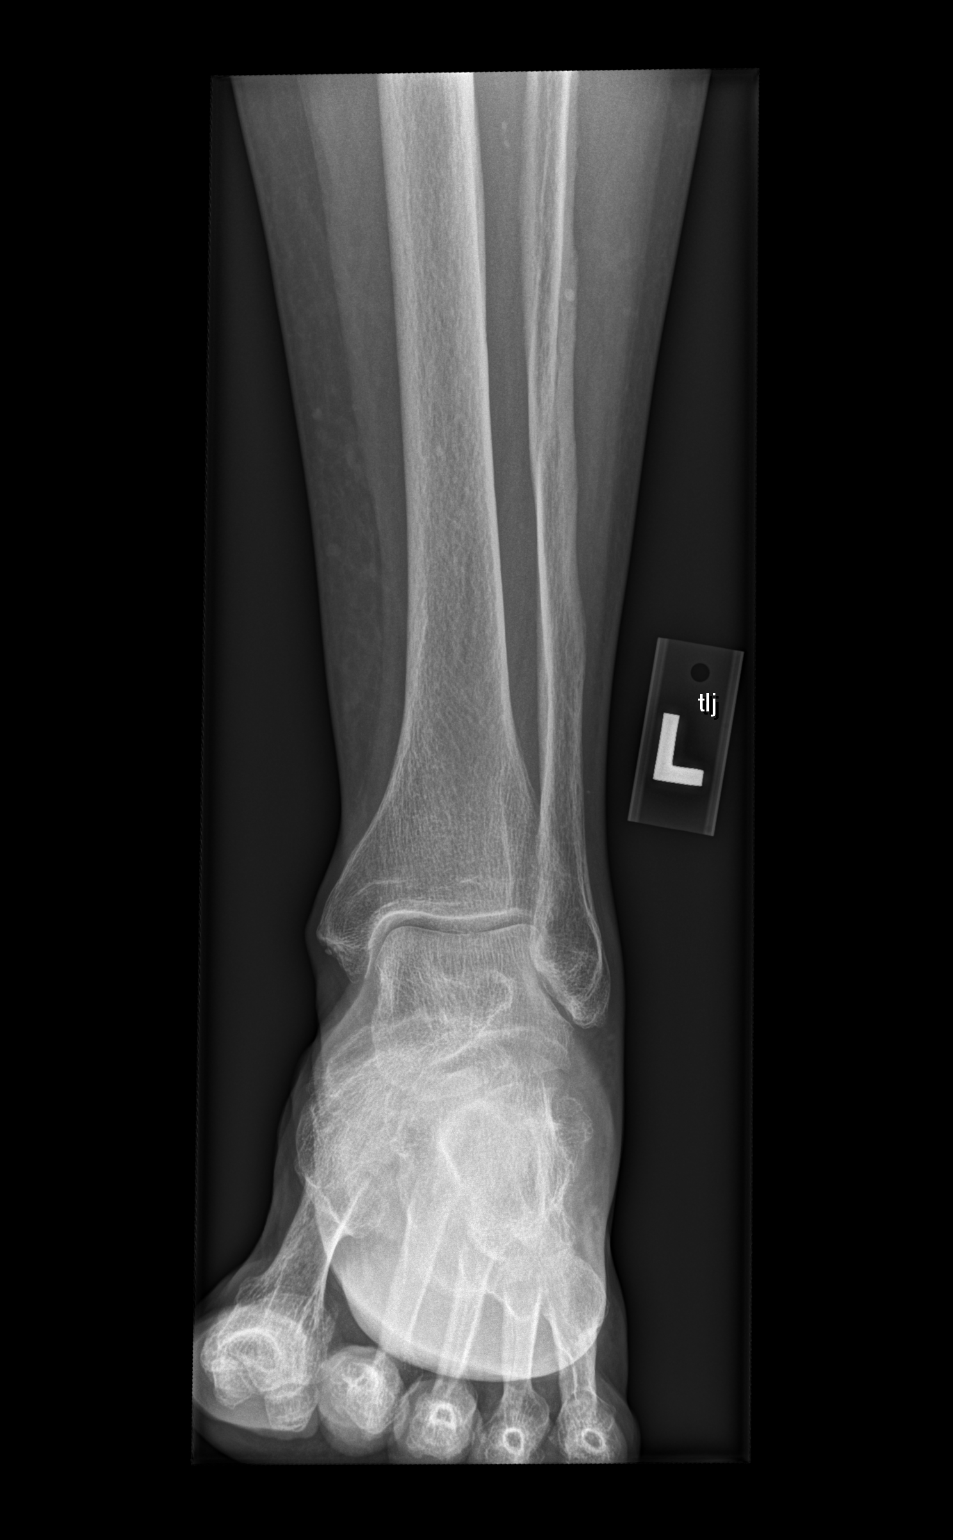

[x ankle obl left]
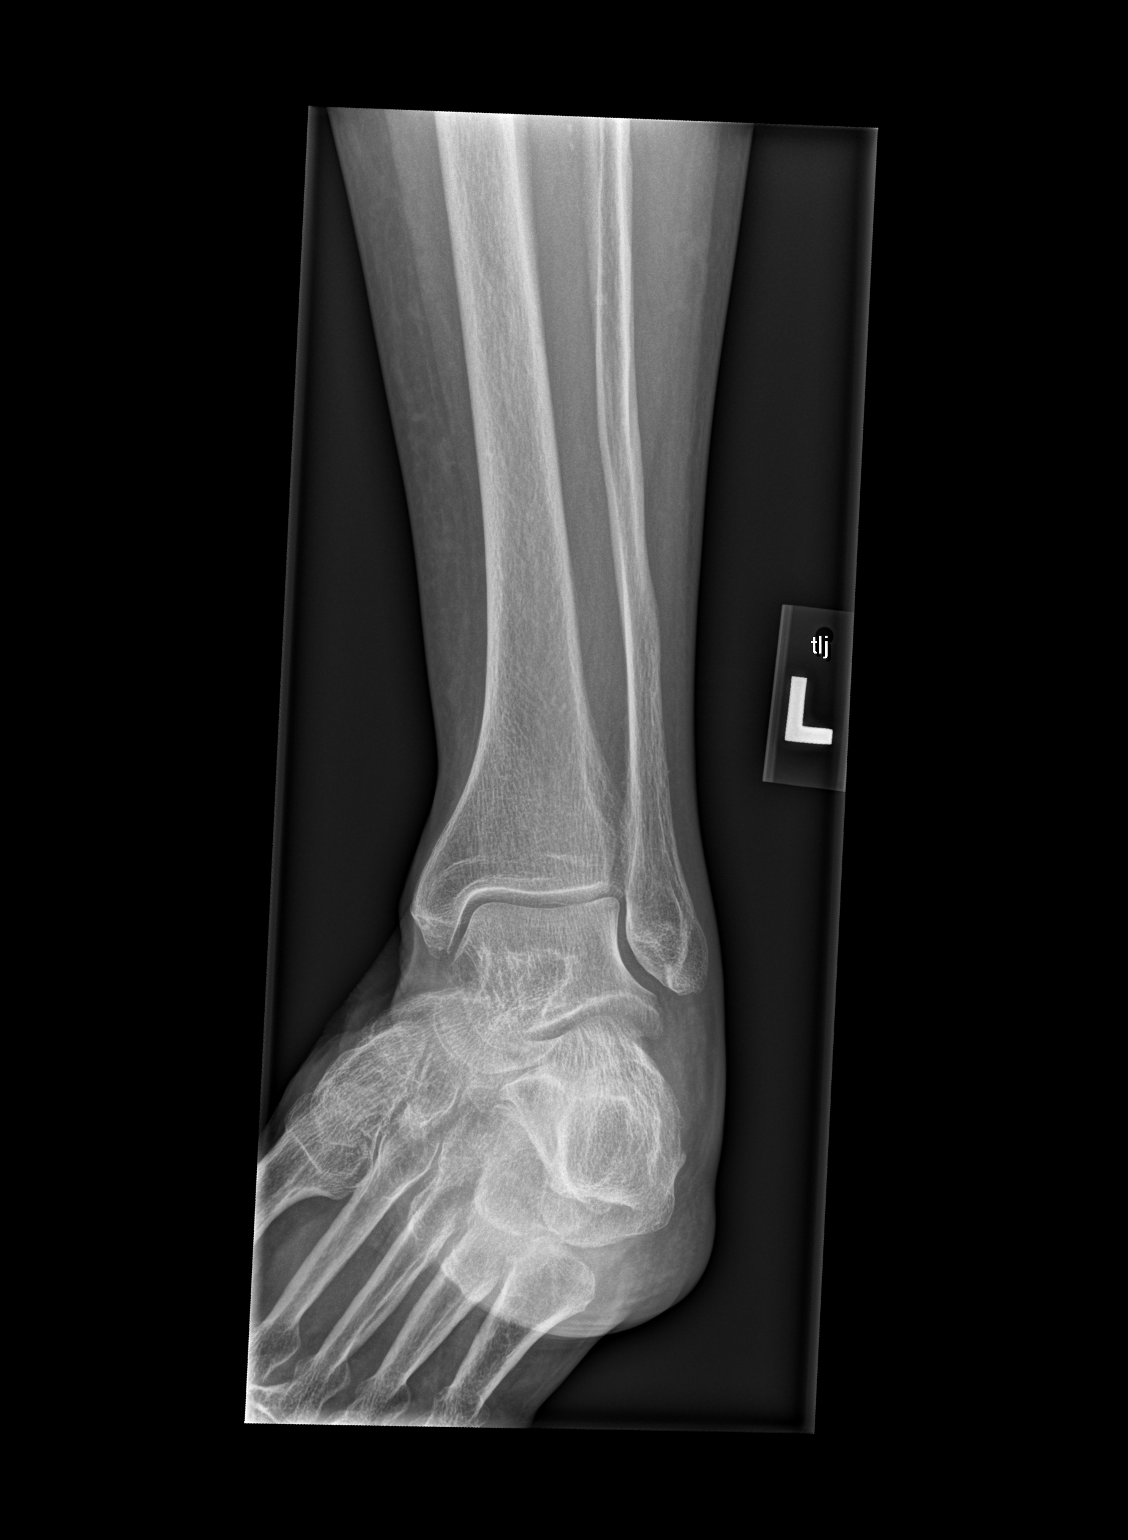

[x ankle lat left]
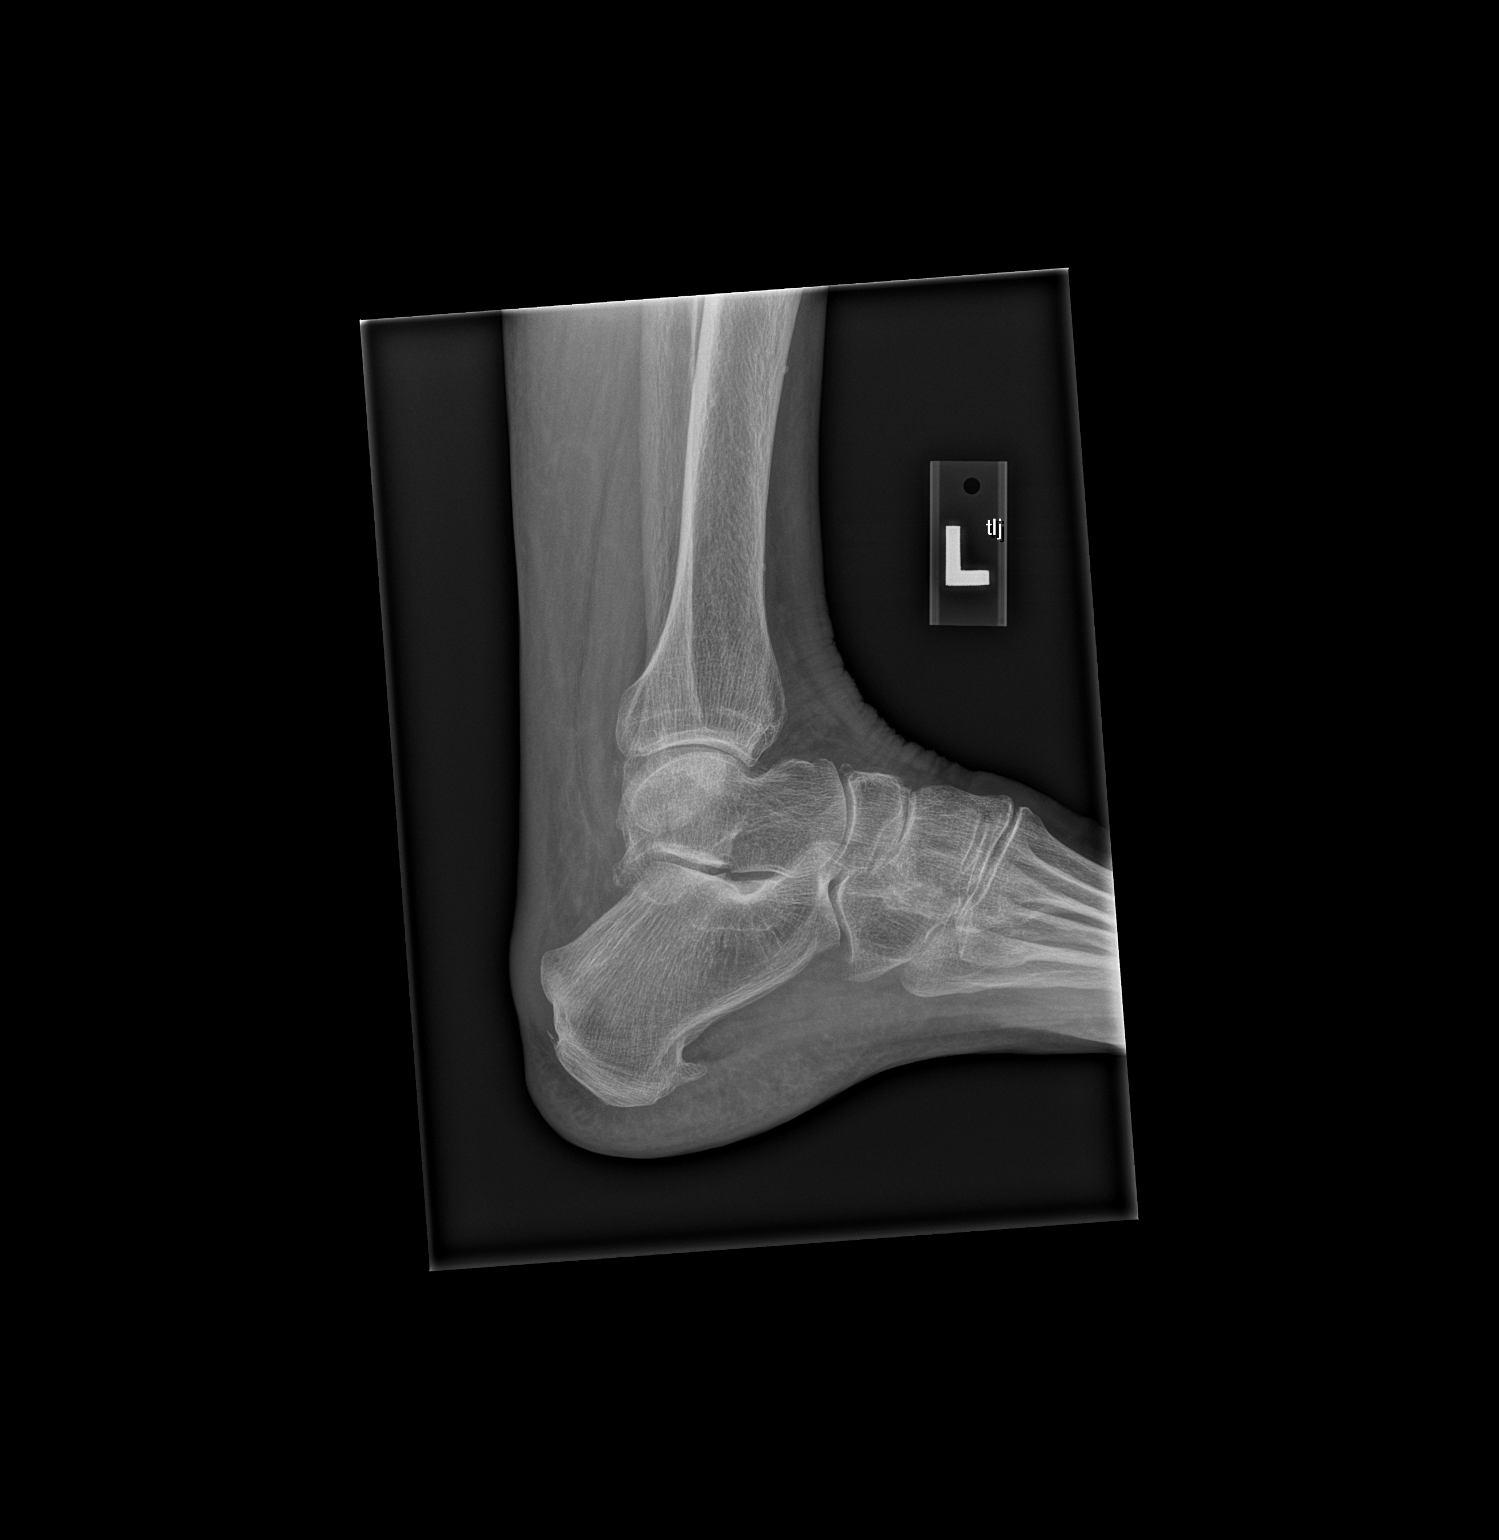

[3 of 3 positions shown; findings below may reference images not displayed]

FINDINGS: Question thickening of the Achilles tendon that could go along with
tendinitis. The ankle joint and subtalar joint appear normal. No
evidence of fracture. Plantar calcaneal spur incidentally noted,
similar to the previous study.
IMPRESSION: No ankle joint pathology. Small plantar calcaneal spur. Question
soft tissue thickening of the distal Achilles tendon that could go
along with tendinitis.

## 2021-11-01 DIAGNOSIS — H353131 Nonexudative age-related macular degeneration, bilateral, early dry stage: Secondary | ICD-10-CM | POA: Diagnosis not present

## 2021-11-01 DIAGNOSIS — Z961 Presence of intraocular lens: Secondary | ICD-10-CM | POA: Diagnosis not present

## 2021-11-28 ENCOUNTER — Encounter: Payer: Self-pay | Admitting: Nurse Practitioner

## 2021-11-29 ENCOUNTER — Encounter: Payer: Self-pay | Admitting: Nurse Practitioner

## 2021-11-29 ENCOUNTER — Ambulatory Visit: Payer: Medicare PPO | Admitting: Nurse Practitioner

## 2021-11-29 VITALS — BP 110/60 | HR 67 | Temp 97.6°F | Resp 18 | Ht 61.0 in | Wt 125.0 lb

## 2021-11-29 DIAGNOSIS — M1612 Unilateral primary osteoarthritis, left hip: Secondary | ICD-10-CM | POA: Diagnosis not present

## 2021-11-29 DIAGNOSIS — D649 Anemia, unspecified: Secondary | ICD-10-CM | POA: Diagnosis not present

## 2021-11-29 DIAGNOSIS — M810 Age-related osteoporosis without current pathological fracture: Secondary | ICD-10-CM | POA: Diagnosis not present

## 2021-11-29 DIAGNOSIS — I10 Essential (primary) hypertension: Secondary | ICD-10-CM

## 2021-11-29 DIAGNOSIS — I471 Supraventricular tachycardia: Secondary | ICD-10-CM | POA: Diagnosis not present

## 2021-11-29 NOTE — Assessment & Plan Note (Signed)
Didn't tolerate Foxamax, not tolerating Forteo well, will update DEXA, re-evaluate in 2 weeks.

## 2021-11-29 NOTE — Assessment & Plan Note (Signed)
blood pressure is controlled, takes Metoprolol. Update CMP/eGFR, TSH, Hgb a1c, lipid panel.

## 2021-11-29 NOTE — Assessment & Plan Note (Signed)
Heart rate is in control, takes Metoprolol.  

## 2021-11-29 NOTE — Assessment & Plan Note (Signed)
Hgb 9.6 03/16/20, update CBC/diff, Vit B12

## 2021-11-29 NOTE — Assessment & Plan Note (Signed)
takes prn Tylenol, Norco, Robaxin, Tramadol

## 2021-11-29 NOTE — Progress Notes (Signed)
Location:  cl[name]   Place of Service:   clinic [place] Provider: Marlana Latus NP  Code Status: DNR Goals of Care: [place]    11/28/2021   11:22 AM  Advanced Directives  Does Patient Have a Medical Advance Directive? Yes  Type of Paramedic of [place];Living will  Does patient want to make changes to medical advance directive? No - Patient declined  Copy of [place] in Chart? Yes - validated most recent copy scanned in chart (See row information)     Chief Complaint  Patient presents with   Establish Care    Patient is here to establish care, discuss need for updated vaccines    HPI: Patient is a 88 y.o. female seen today for medical management of chronic diseases.    OA, takes prn Tylenol, Norco, Robaxin, Tramadol  Heart rate is in control,  takes Metoprolol.   Anemia, Hgb 9.6 03/16/20  OP t score -3.4 11/10/19, on Vit D, Ca     Past Medical History:  Diagnosis Date   Arthritis    Dyspnea    with exertion   History of kidney stones    1962   SVT (supraventricular tachycardia) ([place])     Past Surgical History:  Procedure Laterality Date   TONSILLECTOMY     TOTAL HIP ARTHROPLASTY Left 03/15/2020   Procedure: TOTAL HIP ARTHROPLASTY ANTERIOR APPROACH;  Surgeon: Gaynelle Arabian, MD;  Location: [facility];  Service: Orthopedics;  Laterality: Left;  141mn   VAGINAL DELIVERY     x4    No Known Allergies  Allergies as of 11/29/2021   No Known Allergies      Medication List        Accurate as of November 29, 2021 11:59 PM. If you have any questions, ask your nurse or doctor.          acetaminophen 500 MG tablet Commonly known as: TYLENOL Take 1,000 mg by mouth every 6 (six) hours as needed for mild pain.   calcitonin (salmon) 200 UNIT/ACT nasal spray Commonly known as: MIACALCIN/FORTICAL Place 1 spray into the nose daily.   calcium carbonate 600 MG tablet Commonly known as: OS-CAL Take 600 mg by mouth daily.    Glucosamine Sulfate 1000 MG Caps Take 1,000 mg by mouth in the morning and at bedtime.   HYDROcodone-acetaminophen 5-325 MG tablet Commonly known as: NORCO/VICODIN Take 1-2 tablets by mouth every 6 (six) hours as needed for severe pain.   Lutein-Zeaxanthin 25-5 MG Caps Take 1 capsule by mouth daily.   methocarbamol 500 MG tablet Commonly known as: ROBAXIN Take 1 tablet (500 mg total) by mouth every 6 (six) hours as needed for muscle spasms.   metoprolol tartrate 25 MG tablet Commonly known as: LOPRESSOR TAKE 1 TABLET TWICE DAILY.   traMADol 50 MG tablet Commonly known as: ULTRAM Take 1-2 tablets (50-100 mg total) by mouth every 6 (six) hours as needed for moderate pain.   VITAMIN B COMPLEX PO Take 1 tablet by mouth every Monday, Wednesday, and Friday.   Vitamin D 50 MCG (2000 UT) tablet Take 2,000 Units by mouth daily.        Review of Systems:  Review of Systems  Constitutional:  Negative for activity change, fatigue and fever.  HENT:  Negative for congestion and trouble swallowing.   Eyes:  Negative for visual disturbance.  Respiratory:  Negative for cough and shortness of breath.   Cardiovascular:  Negative for chest pain, palpitations and  leg swelling.  Gastrointestinal:  Negative for abdominal pain and constipation.  Genitourinary:  Negative for dysuria and urgency.  Musculoskeletal:  Positive for arthralgias. Negative for gait problem.  Skin:  Negative for color change.  Neurological:  Negative for speech difficulty and headaches.  Psychiatric/Behavioral:  Negative for confusion and sleep disturbance. The patient is not nervous/anxious.     Health Maintenance  Topic Date Due   Zoster Vaccines- Shingrix (1 of 2) Never done   COVID-19 Vaccine (9 - Moderna risk series) 11/02/2021   INFLUENZA VACCINE  11/20/2021   TETANUS/TDAP  06/05/2027   Pneumonia Vaccine 72+ Years old  Completed   DEXA SCAN  Completed   HPV VACCINES  Aged Out    Physical  Exam: Vitals:   11/29/21 1454  BP: 110/60  Pulse: 67  Resp: 18  Temp: 97.6 F (36.4 C)  SpO2: 96%  Weight: 125 lb (56.7 kg)  Height: _0  (1.549 m)   Body mass index is 23.62 kg/m. Physical Exam Vitals and nursing note reviewed.  Constitutional:      Appearance: Normal appearance.  HENT:     Head: Normocephalic and atraumatic.     Nose: Nose normal.     Mouth/Throat:     Mouth: Mucous membranes are moist.  Eyes:     Extraocular Movements: Extraocular movements intact.     Conjunctiva/sclera: Conjunctivae normal.     Pupils: Pupils are equal, round, and reactive to light.  Cardiovascular:     Rate and Rhythm: Normal rate.     Heart sounds: No murmur heard. Pulmonary:     Effort: Pulmonary effort is normal.     Breath sounds: No rales.  Abdominal:     General: Bowel sounds are normal.     Palpations: Abdomen is soft.     Tenderness: There is no abdominal tenderness.  Musculoskeletal:        General: Normal range of motion.     Cervical back: Normal range of motion and neck supple.     Right lower leg: No edema.     Left lower leg: No edema.  Skin:    General: Skin is warm and dry.  Neurological:     General: No focal deficit present.     Mental Status: She is alert and oriented to person, place, and time. Mental status is at baseline.     Motor: No weakness.     Gait: Gait normal.  Psychiatric:        Mood and Affect: Mood normal.        Behavior: Behavior normal.        Thought Content: Thought content normal.        Judgment: Judgment normal.     Labs reviewed: Basic Metabolic Panel: No results for input(s): "NA", "K", "CL", "CO2", "GLUCOSE", "BUN", "CREATININE", "CALCIUM", "MG", "PHOS", "TSH" in the last 8760 hours. Liver Function Tests: No results for input(s): "AST", "ALT", "ALKPHOS", "BILITOT", "PROT", "ALBUMIN" in the last 8760 hours. No results for input(s): "LIPASE", "AMYLASE" in the last 8760 hours. No results for input(s): "AMMONIA" in the last  8760 hours. CBC: No results for input(s): "WBC", "NEUTROABS", "HGB", "HCT", "MCV", "PLT" in the last 8760 hours. Lipid Panel: No results for input(s): "CHOL", "HDL", "LDLCALC", "TRIG", "CHOLHDL", "LDLDIRECT" in the last 8760 hours. No results found for: "HGBA1C"  Procedures since last visit: DG BONE DENSITY (DXA)  Result Date: 12/03/2021 EXAM: DUAL X-RAY ABSORPTIOMETRY (DXA) FOR BONE MINERAL DENSITY IMPRESSION: Referring Physician:  Kinberly Perris X  Kimyatta Lecy Your patient completed a bone mineral density test using GE Lunar iDXA system (analysis version: 16). Technologist: EDH PATIENT: Name: Nancy Conrad, Nancy Conrad Patient ID: [id number] Birth Date: [birth year] Height: 60.5 in. Sex: Female Measured: 12/03/2021 Weight: 123.2 lbs. Indications: Advanced Age, Caucasian, Estrogen Deficient, Left hip replaced, Postmenopausal Fractures: Right Wrist Treatments: Forteo, Vitamin D (E933.5) ASSESSMENT: The BMD measured at Forearm Radius 33% is 0.533 g/cm2 with a T-score of -3.9. This patient is considered osteoporotic according to [facility]) criteria. The quality of the exam is good. The lumbar spine was excluded due to being excluded on prior exam. Left hip excluded due to surgical hardware. Site Region Measured Date Measured Age YA BMD Significant CHANGE T-score Left Forearm Radius 33% 12/03/2021 87.9 -3.9 0.533 g/cm2 * Left Forearm Radius 33% 11/10/2019 85.9 -3.4 0.589 g/cm2 Right Femur Neck 12/03/2021 87.9 -0.2 1.008 g/cm2 Right Femur Neck 11/10/2019 85.9 -0.3 0.991 g/cm2 Right Femur Total 12/03/2021 87.9 -2.0 0.752 g/cm2 * Right Femur Total 11/10/2019 85.9 -1.5 0.825 g/cm2 World Health Organization [facility]) criteria for post-menopausal, Caucasian Women: Normal       T-score at or above -1 SD Osteopenia   T-score between -1 and -2.5 SD Osteoporosis T-score at or below -2.5 SD RECOMMENDATION: 1. All patients should optimize calcium and vitamin D intake. 2. Consider FDA-approved medical therapies in postmenopausal women  and men aged 66 years and older, based on the following: a. A hip or vertebral (clinical or morphometric) fracture. b. T-score = -2.5 at the femoral neck or spine after appropriate evaluation to exclude secondary causes. c. Low bone mass (T-score between -1.0 and -2.5 at the femoral neck or spine) and a 10-year probability of a hip fracture = 3% or a 10-year probability of a major osteoporosis-related fracture = 20% based on the [place]-adapted WHO algorithm. d. Clinician judgment and/or patient preferences may indicate treatment for people with 10-year fracture probabilities above or below these levels. FOLLOW-UP: Patients with diagnosis of osteoporosis or at high risk for fracture should have regular bone mineral density tests. Patients eligible for Medicare are allowed routine testing every 2 years. The testing frequency can be increased to one year for patients who have rapidly progressing disease, are receiving or discontinuing medical therapy to restore bone mass, or have additional risk factors. I have reviewed this study and agree with the findings. [facility] Electronically Signed   By: Ammie Ferrier M.D.   On: 12/03/2021 11:43    Assessment/Plan  HTN (hypertension) blood pressure is controlled, takes Metoprolol. Update CMP/eGFR, TSH, Hgb a1c, lipid panel.   Primary osteoarthritis of left hip takes prn Tylenol, Norco, Robaxin, Tramadol  Anemia Hgb 9.6 03/16/20, update CBC/diff, Vit B12  SVT (supraventricular tachycardia) (HCC) Heart rate is in control,  takes Metoprolol.   Osteoporosis Didn't tolerate Foxamax, not tolerating Forteo well, will update DEXA, re-evaluate in 2 weeks.    Labs/tests ordered:  CBC/diff, CMP/eGFR, Vit B12, Vit D, lipid panel, TSH, Hgb a1c . DEXA Next appt:  2 weeks

## 2021-12-03 ENCOUNTER — Ambulatory Visit
Admission: RE | Admit: 2021-12-03 | Discharge: 2021-12-03 | Disposition: A | Discharge status: Home / Self Care | Payer: Medicare PPO | Source: Ambulatory Visit | Attending: Nurse Practitioner | Admitting: Nurse Practitioner

## 2021-12-03 ENCOUNTER — Encounter: Payer: Self-pay | Admitting: Nurse Practitioner

## 2021-12-03 DIAGNOSIS — M810 Age-related osteoporosis without current pathological fracture: Secondary | ICD-10-CM | POA: Diagnosis not present

## 2021-12-03 DIAGNOSIS — Z780 Asymptomatic menopausal state: Secondary | ICD-10-CM | POA: Diagnosis not present

## 2021-12-04 DIAGNOSIS — M810 Age-related osteoporosis without current pathological fracture: Secondary | ICD-10-CM

## 2021-12-04 DIAGNOSIS — D649 Anemia, unspecified: Secondary | ICD-10-CM

## 2021-12-04 DIAGNOSIS — I10 Essential (primary) hypertension: Secondary | ICD-10-CM | POA: Diagnosis not present

## 2021-12-06 ENCOUNTER — Encounter: Payer: Self-pay | Admitting: Nurse Practitioner

## 2021-12-06 ENCOUNTER — Non-Acute Institutional Stay: Payer: Medicare PPO | Admitting: Nurse Practitioner

## 2021-12-06 DIAGNOSIS — M810 Age-related osteoporosis without current pathological fracture: Secondary | ICD-10-CM | POA: Diagnosis not present

## 2021-12-06 DIAGNOSIS — K645 Perianal venous thrombosis: Secondary | ICD-10-CM

## 2021-12-06 DIAGNOSIS — M1610 Unilateral primary osteoarthritis, unspecified hip: Secondary | ICD-10-CM

## 2021-12-06 DIAGNOSIS — D649 Anemia, unspecified: Secondary | ICD-10-CM

## 2021-12-06 DIAGNOSIS — I471 Supraventricular tachycardia: Secondary | ICD-10-CM

## 2021-12-06 DIAGNOSIS — E785 Hyperlipidemia, unspecified: Secondary | ICD-10-CM

## 2021-12-06 MED ORDER — OMEPRAZOLE 20 MG PO CPDR: 20.0000 mg | DELAYED_RELEASE_CAPSULE | Freq: Every day | ORAL | 3 refills | Status: DC

## 2021-12-06 NOTE — Progress Notes (Signed)
Location:   Clinic [facility]   Place of Service:  SNF (31) Provider: Chipper Oman NP  Code Status: DNR Goals of Care: [place]    11/28/2021   11:22 AM  Advanced Directives  Does Patient Have a Medical Advance Directive? Yes  Type of Estate agent of [place];Living will  Does patient want to make changes to medical advance directive? No - Patient declined  Copy of Healthcare Power of Attorney in Chart? Yes - validated most recent copy scanned in chart (See row information)     Chief Complaint  Patient presents with   Acute Visit    Patient is being seen for decrease in hemoglobin     HPI: Patient is a 88 y.o. female seen today for medical management of chronic diseases.      OA, takes prn Tylenol, Norco, Robaxin, Tramadol             Heart rate is in control,  takes Metoprolol.              Anemia, Hgb 9.6 03/16/20, Hgb 13.6 12/04/21             OP t score -3.4 11/10/19, 12/03/21 t score -3.9, on Vit D, Ca, Calcitonin.   HLD 181 12/04/21, declined statin due to intolerance in the past.   Past Medical History:  Diagnosis Date   Arthritis    Dyspnea    with exertion   History of kidney stones    1962   SVT (supraventricular tachycardia) ([facility])     Past Surgical History:  Procedure Laterality Date   TONSILLECTOMY     TOTAL HIP ARTHROPLASTY Left 03/15/2020   Procedure: TOTAL HIP ARTHROPLASTY ANTERIOR APPROACH;  Surgeon: Ollen Gross, MD;  Location: [facility];  Service: Orthopedics;  Laterality: Left;    VAGINAL DELIVERY     x4    No Known Allergies  Allergies as of 12/06/2021   No Known Allergies      Medication List        Accurate as of December 06, 2021 11:59 PM. If you have any questions, ask your nurse or doctor.          acetaminophen 500 MG tablet Commonly known as: TYLENOL Take 1,000 mg by mouth every 6 (six) hours as needed for mild pain.   calcitonin (salmon) 200 UNIT/ACT nasal spray Commonly known as: MIACALCIN/FORTICAL Place 1  spray into the nose daily.   calcium carbonate 600 MG tablet Commonly known as: OS-CAL Take 600 mg by mouth daily.   Glucosamine Sulfate 1000 MG Caps Take 1,000 mg by mouth in the morning and at bedtime.   HYDROcodone-acetaminophen 5-325 MG tablet Commonly known as: NORCO/VICODIN Take 1-2 tablets by mouth every 6 (six) hours as needed for severe pain.   Lutein-Zeaxanthin 25-5 MG Caps Take 1 capsule by mouth daily.   methocarbamol 500 MG tablet Commonly known as: ROBAXIN Take 1 tablet (500 mg total) by mouth every 6 (six) hours as needed for muscle spasms.   metoprolol tartrate 25 MG tablet Commonly known as: LOPRESSOR TAKE 1 TABLET TWICE DAILY.   traMADol 50 MG tablet Commonly known as: ULTRAM Take 1-2 tablets (50-100 mg total) by mouth every 6 (six) hours as needed for moderate pain.   VITAMIN B COMPLEX PO Take 1 tablet by mouth every Monday, Wednesday, and Friday.   Vitamin D 50 MCG (2000 UT) tablet Take 2,000 Units by mouth daily.        Review of  Systems:  Review of Systems  Constitutional:  Negative for activity change, fatigue and fever.  HENT:  Negative for congestion and trouble swallowing.   Eyes:  Negative for visual disturbance.  Respiratory:  Negative for cough and shortness of breath.   Cardiovascular:  Negative for chest pain, palpitations and leg swelling.  Gastrointestinal:  Negative for abdominal pain and constipation.  Genitourinary:  Negative for dysuria and urgency.  Musculoskeletal:  Positive for arthralgias. Negative for gait problem.  Skin:  Negative for color change.  Neurological:  Negative for speech difficulty and headaches.  Psychiatric/Behavioral:  Negative for confusion and sleep disturbance. The patient is not nervous/anxious.     Health Maintenance  Topic Date Due   Zoster Vaccines- Shingrix (1 of 2) Never done   COVID-19 Vaccine (9 - Moderna risk series) 11/02/2021   INFLUENZA VACCINE  11/20/2021   TETANUS/TDAP  06/05/2027    Pneumonia Vaccine 51+ Years old  Completed   DEXA SCAN  Completed   HPV VACCINES  Aged Out    Physical Exam: Vitals:   12/06/21 1559  BP: 128/70  Pulse: 71  Resp: 18  Temp: 97.8 F (36.6 C)  SpO2: 97%  Weight: 125 lb (56.7 kg)  Height: 5\' 1"  (1.549 m)   Body mass index is 23.62 kg/m. Physical Exam Vitals and nursing note reviewed.  Constitutional:      Appearance: Normal appearance.  HENT:     Head: Normocephalic and atraumatic.     Nose: Nose normal.     Mouth/Throat:     Mouth: Mucous membranes are moist.  Eyes:     Extraocular Movements: Extraocular movements intact.     Conjunctiva/sclera: Conjunctivae normal.     Pupils: Pupils are equal, round, and reactive to light.  Cardiovascular:     Rate and Rhythm: Normal rate.     Heart sounds: No murmur heard. Pulmonary:     Effort: Pulmonary effort is normal.     Breath sounds: No rales.  Abdominal:     General: Bowel sounds are normal.     Palpations: Abdomen is soft.     Tenderness: There is no abdominal tenderness.  Genitourinary:    Comments: Hemorrhoids  Musculoskeletal:        General: Normal range of motion.     Cervical back: Normal range of motion and neck supple.     Right lower leg: No edema.     Left lower leg: No edema.  Skin:    General: Skin is warm and dry.  Neurological:     General: No focal deficit present.     Mental Status: She is alert and oriented to person, place, and time. Mental status is at baseline.     Motor: No weakness.     Gait: Gait normal.  Psychiatric:        Mood and Affect: Mood normal.        Behavior: Behavior normal.        Thought Content: Thought content normal.        Judgment: Judgment normal.     Labs reviewed: Basic Metabolic Panel: Recent Labs    12/04/21 0749  NA 139  K 4.3  CL 102  CO2 29  GLUCOSE 84  BUN 21  CREATININE 0.76  CALCIUM 9.4  TSH 0.48   Liver Function Tests: Recent Labs    12/04/21 0749  AST 17  ALT 11  BILITOT 0.6  PROT  6.9   No results for input(s): "LIPASE", "AMYLASE"  in the last 8760 hours. No results for input(s): "AMMONIA" in the last 8760 hours. CBC: Recent Labs    12/04/21 0749  WBC 6.1  NEUTROABS 3,825  HGB 13.6  HCT 40.6  MCV 95.5  PLT 206   Lipid Panel: Recent Labs    12/04/21 0749  CHOL 277*  HDL 70  LDLCALC 181*  TRIG 128  CHOLHDL 4.0   Lab Results  Component Value Date   HGBA1C 5.6 12/04/2021    Procedures since last visit: DG BONE DENSITY (DXA)  Result Date: 12/03/2021 EXAM: DUAL X-RAY ABSORPTIOMETRY (DXA) FOR BONE MINERAL DENSITY IMPRESSION: Referring Physician:  Saraiah Bhat X Nancy Conrad Your patient completed a bone mineral density test using GE Lunar iDXA system (analysis version: 16). Technologist: EDH PATIENT: Name: Nancy Conrad, Nancy Conrad Patient ID: [id number] Birth Date: 1931-08-07 Height: 60.5 in. Sex: Female Measured: 12/03/2021 Weight: 123.2 lbs. Indications: Advanced Age, Caucasian, Estrogen Deficient, Left hip replaced, Postmenopausal Fractures: Right Wrist Treatments: Forteo, Vitamin D (E933.5) ASSESSMENT: The BMD measured at Forearm Radius 33% is 0.533 g/cm2 with a T-score of -3.9. This patient is considered osteoporotic according to World Health Organization [facility]) criteria. The quality of the exam is good. The lumbar spine was excluded due to being excluded on prior exam. Left hip excluded due to surgical hardware. Site Region Measured Date Measured Age YA BMD Significant CHANGE T-score Left Forearm Radius 33% 12/03/2021 87.9 -3.9 0.533 g/cm2 * Left Forearm Radius 33% 11[age]10/2019 85.9 -3.4 0.589 g/cm2 Right Femur Neck 12/03/2021 87.9 -0.2 1.008 g/cm2 Right Femur Neck 11/10/2019 85.9 -0.3 0.991 g/cm2 Right Femur Total 12/03/2021 87.9 -2.0 0.752 g/cm2 * Right Femur Total 11/10/2019 85.9 -1.5 0.825 g/cm2 World Health Organization [facility]) criteria for post-menopausal, Caucasian Women: Normal       T-score at or above -1 SD Osteopenia   T-score between -1 and -2.5 SD Osteoporosis T-score at or below  -2.5 SD RECOMMENDATION: 1. All patients should optimize calcium and vitamin D intake. 2. Consider FDA-approved medical therapies in postmenopausal women and men aged 65 years and older, based on the following: a. A hip or vertebral (clinical or morphometric) fracture. b. T-score = -2.5 at the femoral neck or spine after appropriate evaluation to exclude secondary causes. c. Low bone mass (T-score between -1.0 and -2.5 at the femoral neck or spine) and a 10-year probability of a hip fracture = 3% or a 10-year probability of a major osteoporosis-related fracture = 20% based on the [place]-adapted WHO algorithm. d. Clinician judgment and/or patient preferences may indicate treatment for people with 10-year fracture probabilities above or below these levels. FOLLOW-UP: Patients with diagnosis of osteoporosis or at high risk for fracture should have regular bone mineral density tests. Patients eligible for Medicare are allowed routine testing every 2 years. The testing frequency can be increased to one year for patients who have rapidly progressing disease, are receiving or discontinuing medical therapy to restore bone mass, or have additional risk factors. I have reviewed this study and agree with the findings. [facility] Electronically Signed   By: Frederico Hamman M.D.   On: 12/03/2021 11:43    Assessment/Plan  Anemia 12/04/21 Hgb 13.6 Vit B12 396, LDL 181, Hgb a1c 5.6, TSH 5,45 Denied indigestion, upset stomach, abd pain, blood in stool or urine   Osteoporosis  OP t score -3.4 11/10/19, 12/03/21 t score -3.9, on Vit D, Ca, Calcitonin.   Hyperlipidemia 181 12/04/21, suggesting  Atorvastatin 10mg  qd-the patient declined 2/2 intolerance in the past.   OA (osteoarthritis) of  hip  takes prn Tylenol, Norco, Robaxin, Tramadol  SVT (supraventricular tachycardia) (HCC) Heart rate is controlled, takes Metoprolol.   Hemorrhoids, external Stable.     Labs/tests ordered: none  Next appt:  f/u 6  months.

## 2021-12-06 NOTE — Assessment & Plan Note (Addendum)
12/04/21 Hgb 13.6 Vit B12 396, LDL 181, Hgb a1c 5.6, TSH 3,55 Denied indigestion, upset stomach, abd pain, blood in stool or urine

## 2021-12-06 NOTE — Assessment & Plan Note (Signed)
OP t score -3.4 11/10/19, 12/03/21 t score -3.9, on Vit D, Ca, Calcitonin.

## 2021-12-06 NOTE — Assessment & Plan Note (Signed)
takes prn Tylenol, Norco, Robaxin, Tramadol 

## 2021-12-06 NOTE — Assessment & Plan Note (Signed)
Stable

## 2021-12-06 NOTE — Assessment & Plan Note (Signed)
Heart rate is controlled, takes Metoprolol.

## 2021-12-06 NOTE — Assessment & Plan Note (Addendum)
181 12/04/21, suggesting  Atorvastatin 10mg  qd-the patient declined 2/2 intolerance in the past.

## 2021-12-07 LAB — CBC WITH DIFFERENTIAL/PLATELET
Absolute Monocytes: 604 cells/uL (ref 200–950)
Basophils Absolute: 43 cells/uL (ref 0–200)
Basophils Relative: 0.7 %
Eosinophils Absolute: 281 cells/uL (ref 15–500)
Eosinophils Relative: 4.6 %
HCT: 40.6 % (ref 35.0–45.0)
Hemoglobin: 13.6 g/dL (ref 11.7–15.5)
Lymphs Abs: 1348 cells/uL (ref 850–3900)
MCH: 32 pg (ref 27.0–33.0)
MCHC: 33.5 g/dL (ref 32.0–36.0)
MCV: 95.5 fL (ref 80.0–100.0)
MPV: 9.3 fL (ref 7.5–12.5)
Monocytes Relative: 9.9 %
Neutro Abs: 3825 cells/uL (ref 1500–7800)
Neutrophils Relative %: 62.7 %
Platelets: 206 10*3/uL (ref 140–400)
RBC: 4.25 10*6/uL (ref 3.80–5.10)
RDW: 12.3 % (ref 11.0–15.0)
Total Lymphocyte: 22.1 %
WBC: 6.1 10*3/uL (ref 3.8–10.8)

## 2021-12-07 LAB — LIPID PANEL
Cholesterol: 277 mg/dL — ABNORMAL HIGH (ref <200)
HDL: 70 mg/dL (ref >=50)
LDL Cholesterol (Calc): 181 mg/dL (calc) — ABNORMAL HIGH
Non-HDL Cholesterol (Calc): 207 mg/dL (calc) — ABNORMAL HIGH (ref <130)
Total CHOL/HDL Ratio: 4 (calc) (ref <5.0)
Triglycerides: 128 mg/dL (ref <150)

## 2021-12-07 LAB — VITAMIN B12: Vitamin B-12: 396 pg/mL (ref 200–1100)

## 2021-12-07 LAB — COMPLETE METABOLIC PANEL WITH GFR
AG Ratio: 1.8 (calc) (ref 1.0–2.5)
ALT: 11 U/L (ref 6–29)
AST: 17 U/L (ref 10–35)
Albumin: 4.4 g/dL (ref 3.6–5.1)
Alkaline phosphatase (APISO): 77 U/L (ref 37–153)
BUN: 21 mg/dL (ref 7–25)
CO2: 29 mmol/L (ref 20–32)
Calcium: 9.4 mg/dL (ref 8.6–10.4)
Chloride: 102 mmol/L (ref 98–110)
Creat: 0.76 mg/dL (ref 0.60–0.95)
Globulin: 2.5 g/dL (calc) (ref 1.9–3.7)
Glucose, Bld: 84 mg/dL (ref 65–99)
Potassium: 4.3 mmol/L (ref 3.5–5.3)
Sodium: 139 mmol/L (ref 135–146)
Total Bilirubin: 0.6 mg/dL (ref 0.2–1.2)
Total Protein: 6.9 g/dL (ref 6.1–8.1)
eGFR: 76 mL/min/{1.73_m2} (ref >=60)

## 2021-12-07 LAB — TSH: TSH: 0.48 mIU/L (ref 0.40–4.50)

## 2021-12-07 LAB — VITAMIN D 1,25 DIHYDROXY
Vitamin D 1, 25 (OH)2 Total: 42 pg/mL (ref 18–72)
Vitamin D2 1, 25 (OH)2: <8 pg/mL
Vitamin D3 1, 25 (OH)2: 42 pg/mL

## 2021-12-07 LAB — HEMOGLOBIN A1C
Hgb A1c MFr Bld: 5.6 % of total Hgb (ref <5.7)
Mean Plasma Glucose: 114 mg/dL
eAG (mmol/L): 6.3 mmol/L

## 2021-12-11 ENCOUNTER — Other Ambulatory Visit: Payer: Medicare PPO

## 2021-12-13 ENCOUNTER — Encounter: Payer: Medicare PPO | Admitting: Nurse Practitioner

## 2022-03-22 ENCOUNTER — Ambulatory Visit (INDEPENDENT_AMBULATORY_CARE_PROVIDER_SITE_OTHER): Payer: Medicare PPO | Admitting: Family

## 2022-03-22 ENCOUNTER — Encounter: Payer: Self-pay | Admitting: Family

## 2022-03-22 ENCOUNTER — Ambulatory Visit
Admission: RE | Admit: 2022-03-22 | Discharge: 2022-03-22 | Disposition: A | Discharge status: Home / Self Care | Payer: Medicare PPO | Source: Ambulatory Visit | Attending: Family | Admitting: Family

## 2022-03-22 VITALS — BP 116/70 | HR 67 | Temp 99.0°F | Resp 16 | Ht 61.0 in | Wt 124.2 lb

## 2022-03-22 DIAGNOSIS — R0989 Other specified symptoms and signs involving the circulatory and respiratory systems: Secondary | ICD-10-CM | POA: Diagnosis not present

## 2022-03-22 DIAGNOSIS — R519 Headache, unspecified: Secondary | ICD-10-CM

## 2022-03-22 DIAGNOSIS — R059 Cough, unspecified: Secondary | ICD-10-CM

## 2022-03-22 DIAGNOSIS — R630 Anorexia: Secondary | ICD-10-CM | POA: Diagnosis not present

## 2022-03-22 DIAGNOSIS — R509 Fever, unspecified: Secondary | ICD-10-CM | POA: Diagnosis not present

## 2022-03-22 MED ORDER — DOXYCYCLINE HYCLATE 100 MG PO TABS: 100.0000 mg | ORAL_TABLET | Freq: Two times a day (BID) | ORAL | 0 refills | Status: AC

## 2022-03-22 MED ORDER — GUAIFENESIN-DM 100-10 MG/5ML PO SYRP: 5.0000 mL | ORAL_SOLUTION | ORAL | 0 refills | Status: DC | PRN

## 2022-03-22 NOTE — Progress Notes (Unsigned)
Provider: Marlowe Sax FNP-C  Mast, Man X, NP  Patient Care Team: Mast, Man X, NP as PCP - General (Internal Medicine)  Extended Emergency Contact Information Primary Emergency Contact: [facility] Address: [place] [address] [place] Phone: [phone] Mobile Phone: [phone] Relation: Daughter Secondary Emergency Contact: McKinney,Linda Address: [place] [address] [place] Phone: [phone] Relation: Daughter  Code Status:  DNR Goals of care: Advanced Directive information    03/22/2022    1:41 PM  Advanced Directives  Does Patient Have a Medical Advance Directive? Yes  Type of Advance Directive Living will;Healthcare Power of [place];Out of facility DNR (pink MOST or yellow form)  Does patient want to make changes to medical advance directive? No - Patient declined  Copy of [place] in Chart? No - copy requested     Chief Complaint  Patient presents with   Acute Visit    [facility] patient complains of cough, runny nose, headache, loss of appetitive, and low grade fever.    HPI:  Nancy Conrad is a 88 y.o. female seen today for an acute visit for evaluation of cough ,runny nose,headache and loss of appetite x 5 days.States temp was 100.0 took tylenol at 11 am this morning.coughs up white clear mucous.she was with a friend at church who was coughing on Sunday.Hears noisy noise in the chest whenever she lies down.She denies any fatigue,body aches,chest tightness,chest pain,palpitation or shortness of breath.  Past Medical History:  Diagnosis Date   Arthritis    Dyspnea    with exertion   History of kidney stones    1962   SVT (supraventricular tachycardia)    Past Surgical History:  Procedure Laterality Date   TONSILLECTOMY     TOTAL HIP ARTHROPLASTY Left 03/15/2020   Procedure: TOTAL HIP ARTHROPLASTY ANTERIOR APPROACH;  Surgeon: Gaynelle Arabian, MD;   Location: [facility];  Service: Orthopedics;  Laterality: Left;  188min   VAGINAL DELIVERY     x4    Allergies  Allergen Reactions   Codeine Other (See Comments)    Outpatient Encounter Medications as of 03/22/2022  Medication Sig   acetaminophen (TYLENOL) 500 MG tablet Take 1,000 mg by mouth every 6 (six) hours as needed for mild pain.    B Complex Vitamins (VITAMIN B COMPLEX PO) Take 1 tablet by mouth every Monday, Wednesday, and Friday.    calcium carbonate (OS-CAL) 600 MG tablet Take 600 mg by mouth daily.    Cholecalciferol (VITAMIN D) 50 MCG (2000 UT) tablet Take 2,000 Units by mouth daily.    Glucosamine Sulfate 1000 MG CAPS Take 1,000 mg by mouth in the morning and at bedtime.    HYDROcodone-acetaminophen (NORCO/VICODIN) 5-325 MG tablet Take 1-2 tablets by mouth every 6 (six) hours as needed for severe pain.   Lutein-Zeaxanthin 25-5 MG CAPS Take 1 capsule by mouth daily.   metoprolol tartrate (LOPRESSOR) 25 MG tablet TAKE 1 TABLET TWICE DAILY.   omeprazole (PRILOSEC) 20 MG capsule Take 20 mg by mouth daily.   [DISCONTINUED] calcitonin, salmon, (MIACALCIN/FORTICAL) 200 UNIT/ACT nasal spray Place 1 spray into the nose daily.   [DISCONTINUED] methocarbamol (ROBAXIN) 500 MG tablet Take 1 tablet (500 mg total) by mouth every 6 (six) hours as needed for muscle spasms.   [DISCONTINUED] traMADol (ULTRAM) 50 MG tablet Take 1-2 tablets (50-100  mg total) by mouth every 6 (six) hours as needed for moderate pain.   No facility-administered encounter medications on file as of 03/22/2022.    Review of Systems  Constitutional:  Positive for appetite change and fever. Negative for chills, fatigue and unexpected weight change.  HENT:  Positive for rhinorrhea. Negative for congestion, dental problem, ear discharge, ear pain, facial swelling, hearing loss, nosebleeds, postnasal drip, sinus pressure, sinus pain, sneezing, sore throat, tinnitus and trouble swallowing.   Eyes:  Negative for pain,  discharge, redness, itching and visual disturbance.  Respiratory:  Positive for cough. Negative for chest tightness, shortness of breath and wheezing.   Cardiovascular:  Negative for chest pain, palpitations and leg swelling.  Gastrointestinal:  Negative for abdominal distention, abdominal pain, constipation, nausea and vomiting.       Had diarrhea this morning but none since then   Musculoskeletal:  Negative for arthralgias, back pain, gait problem, joint swelling, myalgias, neck pain and neck stiffness.  Skin:  Negative for color change, pallor and rash.  Neurological:  Positive for headaches. Negative for dizziness, syncope, speech difficulty, weakness, light-headedness and numbness.  Hematological:  Does not bruise/bleed easily.  Psychiatric/Behavioral:  Negative for agitation, behavioral problems, confusion, hallucinations and sleep disturbance. The patient is not nervous/anxious.     Immunization History  Administered Date(s) Administered   Hepatitis A, Adult 08/31/2014   Influenza, High Dose Seasonal PF 01/18/2015, 02/09/2019, 01/30/2021   Influenza,inj,quad, With Preservative 01/20/2018, 01/21/2019   Moderna Covid-19 Vaccine Bivalent Booster 65yrs & up 09/07/2021   Moderna Sars-Covid-2 Vaccination 04/26/2019, 05/06/2019, 05/24/2019, 05/28/2019, 02/29/2020, 09/19/2020   Pfizer Covid-19 Vaccine Bivalent Booster 52yrs & up 01/09/2021   Pneumococcal Conjugate-13 08/31/2014   Pneumococcal Polysaccharide-23 09/04/2020   Td 06/04/2017   Zoster, Live 08/27/2019, 09/04/2020   Pertinent  Health Maintenance Due  Topic Date Due   INFLUENZA VACCINE  11/20/2021   DEXA SCAN  Completed      03/15/2020   10:15 AM 03/15/2020    7:05 PM 03/16/2020    9:20 AM 11/28/2021   11:23 AM 03/22/2022    1:41 PM  Fall Risk  Falls in the past year?    0 0  Was there an injury with Fall?    0 0  Fall Risk Category Calculator    0 0  Fall Risk Category    Low Low  Patient Fall Risk Level High fall risk  High fall risk High fall risk Low fall risk Low fall risk  Patient at Risk for Falls Due to    No Fall Risks No Fall Risks  Fall risk Follow up    Falls evaluation completed Falls evaluation completed   Functional Status Survey:    Vitals:   03/22/22 1335  BP: 116/70  Pulse: 67  Resp: 16  Temp: 99 F (37.2 C)  SpO2: 95%  Weight: 124 lb 3.2 oz (56.3 kg)  Height: 5\' 1"  (1.549 m)   Body mass index is 23.47 kg/m. Physical Exam Vitals reviewed.  Constitutional:      General: She is not in acute distress.    Appearance: Normal appearance. She is normal weight. She is not ill-appearing or diaphoretic.  HENT:     Head: Normocephalic.     Right Ear: Tympanic membrane, ear canal and external ear normal. There is no impacted cerumen.     Left Ear: Tympanic membrane, ear canal and external ear normal. There is no impacted cerumen.     Nose: Nose normal. No congestion or  rhinorrhea.     Mouth/Throat:     Mouth: Mucous membranes are moist.     Pharynx: Oropharynx is clear. No oropharyngeal exudate or posterior oropharyngeal erythema.  Eyes:     General: No scleral icterus.       Right eye: No discharge.        Left eye: No discharge.     Conjunctiva/sclera: Conjunctivae normal.     Pupils: Pupils are equal, round, and reactive to light.  Neck:     Vascular: No carotid bruit.  Cardiovascular:     Rate and Rhythm: Normal rate and regular rhythm.     Pulses: Normal pulses.     Heart sounds: Normal heart sounds. No murmur heard.    No friction rub. No gallop.  Pulmonary:     Effort: Pulmonary effort is normal. No respiratory distress.     Breath sounds: Examination of the left-upper field reveals rales. Examination of the left-middle field reveals rales. Rales present. No wheezing or rhonchi.  Chest:     Chest wall: No tenderness.  Abdominal:     General: Bowel sounds are normal. There is no distension.     Palpations: Abdomen is soft. There is no mass.     Tenderness: There is no  abdominal tenderness. There is no right CVA tenderness, left CVA tenderness, guarding or rebound.  Musculoskeletal:        General: No swelling or tenderness. Normal range of motion.     Cervical back: Normal range of motion. No rigidity or tenderness.     Right lower leg: No edema.     Left lower leg: No edema.  Lymphadenopathy:     Cervical: No cervical adenopathy.  Skin:    General: Skin is warm and dry.     Coloration: Skin is not pale.     Findings: No bruising, erythema, lesion or rash.  Neurological:     Mental Status: She is alert and oriented to person, place, and time.     Cranial Nerves: No cranial nerve deficit.     Sensory: No sensory deficit.     Motor: No weakness.     Coordination: Coordination normal.     Gait: Gait normal.  Psychiatric:        Mood and Affect: Mood normal.        Speech: Speech normal.        Behavior: Behavior normal.        Thought Content: Thought content normal.        Judgment: Judgment normal.    Labs reviewed: Recent Labs    12/04/21 0749  NA 139  K 4.3  CL 102  CO2 29  GLUCOSE 84  BUN 21  CREATININE 0.76  CALCIUM 9.4   Recent Labs    12/04/21 0749  AST 17  ALT 11  BILITOT 0.6  PROT 6.9   Recent Labs    12/04/21 0749  WBC 6.1  NEUTROABS 3,825  HGB 13.6  HCT 40.6  MCV 95.5  PLT 206   Lab Results  Component Value Date   TSH 0.48 12/04/2021   Lab Results  Component Value Date   HGBA1C 5.6 12/04/2021   Lab Results  Component Value Date   CHOL 277 (H) 12/04/2021   HDL 70 12/04/2021   LDLCALC 181 (H) 12/04/2021   TRIG 128 12/04/2021   CHOLHDL 4.0 12/04/2021    Significant Diagnostic Results in last 30 days:  No results found.  Assessment/Plan 1. Cough, unspecified  type Temp 99.0 Left lung rales on auscultation.will obtain imaging to rule out other acute abnormalities. - start on Doxycycline as below.side effects discussed.will include yogurt in diet to prevent antibiotics associated diarrhea.  -  over the counter mucinex or Robitussin DM for cough.  - POC COVID-19 results negative  - POC Influenza A/B results negative  - DG Chest 2 View; Future - doxycycline (VIBRA-TABS) 100 MG tablet; Take 1 tablet (100 mg total) by mouth 2 (two) times daily for 7 days.  Dispense: 14 tablet; Refill: 0 - guaiFENesin-dextromethorphan (ROBITUSSIN DM) 100-10 MG/5ML syrup; Take 5 mLs by mouth every 4 (four) hours as needed for cough.  Dispense: 118 mL; Refill: 0 -  Please get chest X-ray at [place] at [facility] then will call you with results.  2. Low grade fever Reports temp  -Continue on over-the-counter Tylenol as needed for fever or chills - POC COVID-19 results negative  - POC Influenza A/B results negative   3. Nonintractable headache, unspecified chronicity pattern, unspecified headache type Continue on Tylenol - POC COVID-19 results negative  - POC Influenza A/B results negative   4. Runny nose Recommend over-the-counter loratadine 10 mg daily x 14 days - POC COVID-19 results negative  - POC Influenza A/B results negative   5. Loss of appetite Suspect due to ongoing symptoms -Encouraged to increase fluids/soup/tea - POC COVID-19 results negative  - POC Influenza A/B results negative   Family/ staff Communication: Reviewed plan of care with patient verbalized understanding  Labs/tests ordered:  - DG Chest 2 View; Future - POC COVID-19  - POC Influenza A/B  Next Appointment: Return if symptoms worsen or fail to improve.   Sandrea Hughs, NP

## 2022-03-22 NOTE — Patient Instructions (Signed)
-   Please get chest X-ray at [facility] imaging at [facility] then will call you with results. - Take Vitamin C 500 mg tablet one by mouth twice daily x 14 days  - Tylenol as needed for fever or body aches  - over the counter Mucinex as needed for cough  - increase your fruits intake in your diet  - increase your water intake to 6-8 glasses of water daily  - Notify provider or go to ED if you develop any chest tightness,chest pain or shortness of breath

## 2022-04-01 LAB — POC COVID19 BINAXNOW: SARS Coronavirus 2 Ag: NEGATIVE

## 2022-04-01 LAB — POCT INFLUENZA A/B
Influenza A, POC: NEGATIVE
Influenza B, POC: NEGATIVE

## 2022-05-22 ENCOUNTER — Encounter: Payer: Self-pay | Admitting: Nurse Practitioner

## 2022-05-23 ENCOUNTER — Encounter: Payer: Self-pay | Admitting: Nurse Practitioner

## 2022-05-23 ENCOUNTER — Non-Acute Institutional Stay: Payer: Medicare PPO | Admitting: Nurse Practitioner

## 2022-05-23 VITALS — BP 120/80 | HR 72 | Temp 97.8°F | Resp 16 | Ht 61.0 in | Wt 126.0 lb

## 2022-05-23 DIAGNOSIS — M810 Age-related osteoporosis without current pathological fracture: Secondary | ICD-10-CM

## 2022-05-23 DIAGNOSIS — E785 Hyperlipidemia, unspecified: Secondary | ICD-10-CM | POA: Diagnosis not present

## 2022-05-23 DIAGNOSIS — K219 Gastro-esophageal reflux disease without esophagitis: Secondary | ICD-10-CM | POA: Diagnosis not present

## 2022-05-23 DIAGNOSIS — M1610 Unilateral primary osteoarthritis, unspecified hip: Secondary | ICD-10-CM | POA: Diagnosis not present

## 2022-05-23 MED ORDER — OMEPRAZOLE 20 MG PO CPDR: 20.0000 mg | DELAYED_RELEASE_CAPSULE | Freq: Every day | ORAL | 2 refills | Status: DC

## 2022-05-23 NOTE — Progress Notes (Signed)
Location:   Clinic [facility]   Place of Service:  Clinic (12) Provider: Marlana Latus NP  Code Status: DNR Goals of Care:     05/22/2022   11:32 AM  Advanced Directives  Does Patient Have a Medical Advance Directive? Yes  Type of Advance Directive Out of facility DNR (pink MOST or yellow form);Living will  Does patient want to make changes to medical advance directive? No - Patient declined  Pre-existing out of facility DNR order (yellow form or pink MOST form) Pink MOST form placed in chart (order not valid for inpatient use)     Chief Complaint  Patient presents with   Medical Management of Chronic Issues    6 month follow-up. AWV pending for 06/20/2022 (need to confirm date ok with patient)     HPI: Patient is a 89 y.o. female seen today for medical management of chronic diseases.    OA, takes prn Tylenol, Norco             Heart rate is in control,  takes Metoprolol.              Anemia, Hgb 9.6 03/16/20<<Hgb 13.6 12/04/21             OP t score -3.4 11/10/19, 12/03/21 t score -3.9, on Vit D, Ca, off Calcitonin.              HLD declined statin due to intolerance in the past. LDL 181 12/04/21  GERD, takes Omeprazole     Past Medical History:  Diagnosis Date   Arthritis    Dyspnea    with exertion   History of kidney stones    1962   SVT (supraventricular tachycardia)     Past Surgical History:  Procedure Laterality Date   TONSILLECTOMY     TOTAL HIP ARTHROPLASTY Left 03/15/2020   Procedure: TOTAL HIP ARTHROPLASTY ANTERIOR APPROACH;  Surgeon: Gaynelle Arabian, MD;  Location: [facility];  Service: Orthopedics;  Laterality: Left;  150min   VAGINAL DELIVERY     x4    Allergies  Allergen Reactions   Codeine Other (See Comments)    Allergies as of 05/23/2022       Reactions   Codeine Other (See Comments)        Medication List        Accurate as of May 23, 2022  3:30 PM. If you have any questions, ask your nurse or doctor.          STOP taking these  medications    calcium carbonate 600 MG tablet Commonly known as: OS-CAL Stopped by: Farra Nikolic X Jaylise Peek, NP   guaiFENesin-dextromethorphan 100-10 MG/5ML syrup Commonly known as: ROBITUSSIN DM Stopped by: Tamelia Michalowski X Iver Miklas, NP   HYDROcodone-acetaminophen 5-325 MG tablet Commonly known as: NORCO/VICODIN Stopped by: Michae Grimley X Edwards Mckelvie, NP       TAKE these medications    acetaminophen 500 MG tablet Commonly known as: TYLENOL Take 1,000 mg by mouth every 6 (six) hours as needed for mild pain.   Glucosamine Sulfate 1000 MG Caps Take 1,000 mg by mouth in the morning and at bedtime.   Lutein-Zeaxanthin 25-5 MG Caps Take 1 capsule by mouth daily.   metoprolol tartrate 25 MG tablet Commonly known as: LOPRESSOR TAKE 1 TABLET TWICE DAILY.   omeprazole 20 MG capsule Commonly known as: PRILOSEC Take 1 capsule (20 mg total) by mouth daily.   VITAMIN B COMPLEX PO Take 1 tablet by mouth every Monday, Wednesday, and Friday.  Vitamin D 50 MCG (2000 UT) tablet Take 2,000 Units by mouth daily.        Review of Systems:  Review of Systems  Constitutional:  Negative for activity change, fatigue and fever.  HENT:  Negative for congestion and trouble swallowing.   Eyes:  Negative for visual disturbance.  Respiratory:  Negative for cough and shortness of breath.   Cardiovascular:  Negative for chest pain, palpitations and leg swelling.  Gastrointestinal:  Negative for abdominal pain and constipation.  Genitourinary:  Negative for dysuria and urgency.  Musculoskeletal:  Positive for arthralgias. Negative for gait problem.  Skin:  Negative for color change.  Neurological:  Negative for speech difficulty and headaches.  Psychiatric/Behavioral:  Negative for confusion and sleep disturbance. The patient is not nervous/anxious.     Health Maintenance  Topic Date Due   Medicare Annual Wellness (AWV)  Never done   DTaP/Tdap/Td (2 - Tdap) 06/05/2027   Pneumonia Vaccine 75+ Years old  Completed   INFLUENZA  VACCINE  Completed   DEXA SCAN  Completed   COVID-19 Vaccine  Completed   Zoster Vaccines- Shingrix  Completed   HPV VACCINES  Aged Out    Physical Exam: Vitals:   05/22/22 1131  BP: 120/80  Pulse: 72  Resp: 16  Temp: 97.8 F (36.6 C)  SpO2: 96%  Weight: 126 lb (57.2 kg)  Height: 5\' 1"  (1.549 m)   Body mass index is 23.81 kg/m. Physical Exam Vitals and nursing note reviewed.  Constitutional:      Appearance: Normal appearance.  HENT:     Head: Normocephalic and atraumatic.     Nose: Nose normal.     Mouth/Throat:     Mouth: Mucous membranes are moist.  Eyes:     Extraocular Movements: Extraocular movements intact.     Conjunctiva/sclera: Conjunctivae normal.     Pupils: Pupils are equal, round, and reactive to light.  Cardiovascular:     Rate and Rhythm: Normal rate.     Heart sounds: No murmur heard. Pulmonary:     Effort: Pulmonary effort is normal.     Breath sounds: No rales.  Abdominal:     General: Bowel sounds are normal.     Palpations: Abdomen is soft.     Tenderness: There is no abdominal tenderness.  Genitourinary:    Comments: Hemorrhoids  Musculoskeletal:        General: Normal range of motion.     Cervical back: Normal range of motion and neck supple.     Right lower leg: No edema.     Left lower leg: No edema.  Skin:    General: Skin is warm and dry.  Neurological:     General: No focal deficit present.     Mental Status: She is alert and oriented to person, place, and time. Mental status is at baseline.     Motor: No weakness.     Gait: Gait normal.  Psychiatric:        Mood and Affect: Mood normal.        Behavior: Behavior normal.        Thought Content: Thought content normal.        Judgment: Judgment normal.     Labs reviewed: Basic Metabolic Panel: Recent Labs    12/04/21 0749  NA 139  K 4.3  CL 102  CO2 29  GLUCOSE 84  BUN 21  CREATININE 0.76  CALCIUM 9.4  TSH 0.48   Liver Function Tests: Recent Labs  12/04/21 0749  AST 17  ALT 11  BILITOT 0.6  PROT 6.9   No results for input(s): "LIPASE", "AMYLASE" in the last 8760 hours. No results for input(s): "AMMONIA" in the last 8760 hours. CBC: Recent Labs    12/04/21 0749  WBC 6.1  NEUTROABS 3,825  HGB 13.6  HCT 40.6  MCV 95.5  PLT 206   Lipid Panel: Recent Labs    12/04/21 0749  CHOL 277*  HDL 70  LDLCALC 181*  TRIG 128  CHOLHDL 4.0   Lab Results  Component Value Date   HGBA1C 5.6 12/04/2021    Procedures since last visit: No results found.  Assessment/Plan  OA (osteoarthritis) of hip takes prn Tylenol, Norco  Osteoporosis   OP t score -3.4 11/10/19, 12/03/21 t score -3.9, on Vit D, Ca, off Calcitonin.   Hyperlipidemia  HLD declined statin due to intolerance in the past. LDL 181 12/04/21  GERD (gastroesophageal reflux disease) Stable, continue Omeprazole.    Labs/tests ordered: CBC/diff, CMP/eGFR, TSH, lipid panel, Vit D3  Next appt:  06/20/2022

## 2022-05-23 NOTE — Assessment & Plan Note (Signed)
Stable, continue Omeprazole.  

## 2022-05-23 NOTE — Assessment & Plan Note (Signed)
takes prn Tylenol, Norco

## 2022-05-23 NOTE — Assessment & Plan Note (Signed)
HLD declined statin due to intolerance in the past. LDL 181 12/04/21

## 2022-05-23 NOTE — Assessment & Plan Note (Signed)
OP t score -3.4 11/10/19, 12/03/21 t score -3.9, on Vit D, Ca, off Calcitonin.

## 2022-06-20 ENCOUNTER — Ambulatory Visit (INDEPENDENT_AMBULATORY_CARE_PROVIDER_SITE_OTHER): Payer: Medicare PPO | Admitting: Nurse Practitioner

## 2022-06-20 ENCOUNTER — Encounter: Payer: Self-pay | Admitting: Nurse Practitioner

## 2022-06-20 VITALS — BP 136/80 | HR 71 | Temp 98.0°F | Resp 18 | Ht 61.0 in | Wt 125.0 lb

## 2022-06-20 DIAGNOSIS — Z0000 Encounter for general adult medical examination without abnormal findings: Secondary | ICD-10-CM | POA: Diagnosis not present

## 2022-06-20 DIAGNOSIS — M810 Age-related osteoporosis without current pathological fracture: Secondary | ICD-10-CM | POA: Diagnosis not present

## 2022-06-20 NOTE — Progress Notes (Signed)
Subjective:   Nancy Conrad is a 89 y.o. female who presents for Medicare Annual (Subsequent) preventive examination @ [place].        Objective:    Today's Vitals   06/20/22 1309 06/20/22 1325  BP: 136/80   Pulse: 71   Resp: 18   Temp: 98 F (36.7 C)   SpO2: 96%   Weight: 125 lb (56.7 kg)   Height: '5\' 1"'$  (1.549 m)   PainSc:  2    Body mass index is 23.62 kg/m.     06/20/2022    8:44 AM 05/22/2022   11:32 AM 03/22/2022    1:41 PM 11/28/2021   11:22 AM 03/15/2020   10:15 AM 03/09/2020   10:05 AM  Advanced Directives  Does Patient Have a Medical Advance Directive? Yes Yes Yes Yes Yes Yes  Type of Advance Directive Out of facility DNR (pink MOST or yellow form);Living will Out of facility DNR (pink MOST or yellow form);Living will Living will;Healthcare Power of [place];Out of facility DNR (pink MOST or yellow form) [place];Living will [place];Living will [place];Living will  Does patient want to make changes to medical advance directive? No - Patient declined No - Patient declined No - Patient declined No - Patient declined No - Patient declined   Copy of [place] in Chart?   No - copy requested Yes - validated most recent copy scanned in chart (See row information) Yes - validated most recent copy scanned in chart (See row information) Yes - validated most recent copy scanned in chart (See row information)  Pre-existing out of facility DNR order (yellow form or pink MOST form) Pink MOST form placed in chart (order not valid for inpatient use) Pink MOST form placed in chart (order not valid for inpatient use)        Current Medications (verified) Outpatient Encounter Medications as of 06/20/2022  Medication Sig   acetaminophen (TYLENOL) 500 MG tablet Take 1,000 mg by mouth every 6 (six) hours as needed for mild pain.    B Complex Vitamins (VITAMIN B COMPLEX PO) Take 1  tablet by mouth every Monday, Wednesday, and Friday.    Cholecalciferol (VITAMIN D) 50 MCG (2000 UT) tablet Take 2,000 Units by mouth daily.    Glucosamine Sulfate 1000 MG CAPS Take 1,000 mg by mouth in the morning and at bedtime.    Lutein-Zeaxanthin 25-5 MG CAPS Take 1 capsule by mouth daily.   metoprolol tartrate (LOPRESSOR) 25 MG tablet TAKE 1 TABLET TWICE DAILY.   omeprazole (PRILOSEC) 20 MG capsule Take 1 capsule (20 mg total) by mouth daily.   No facility-administered encounter medications on file as of 06/20/2022.    Allergies (verified) Codeine   History: Past Medical History:  Diagnosis Date   Arthritis    Dyspnea    with exertion   History of kidney stones    1962   SVT (supraventricular tachycardia)    Past Surgical History:  Procedure Laterality Date   TONSILLECTOMY     TOTAL HIP ARTHROPLASTY Left 03/15/2020   Procedure: TOTAL HIP ARTHROPLASTY ANTERIOR APPROACH;  Surgeon: Gaynelle Arabian, MD;  Location: [facility];  Service: Orthopedics;  Laterality: Left;  153mn   VAGINAL DELIVERY     x4   Family History  Problem Relation Age of Onset   CVA Mother    Heart attack Father    Social History   Socioeconomic History   Marital  status: Widowed    Spouse name: Not on file   Number of children: Not on file   Years of education: Not on file   Highest education level: Not on file  Occupational History   Not on file  Tobacco Use   Smoking status: Never   Smokeless tobacco: Never  Vaping Use   Vaping Use: Never used  Substance and Sexual Activity   Alcohol use: Yes    Alcohol/week: 1.0 standard drink of alcohol    Types: 1 Glasses of wine per week    Comment: socially   Drug use: No   Sexual activity: Not on file  Other Topics Concern   Not on file  Social History Narrative   Not on file   Social Determinants of Health   Financial Resource Strain: Not on file  Food Insecurity: Not on file  Transportation Needs: Not on file  Physical Activity: Not on  file  Stress: Not on file  Social Connections: Not on file    Tobacco Counseling Counseling given: Not Answered   Clinical Intake:  Pre-visit preparation completed: Yes  Pain : 0-10 Pain Score: 2  Pain Type: Chronic pain Pain Location: Back Pain Orientation: Mid Pain Radiating Towards: localized Pain Descriptors / Indicators: Aching Pain Onset: More than a month ago Pain Frequency: Several days a week Pain Relieving Factors: ibuprofen, warm bath/swim Effect of Pain on Daily Activities: no  Pain Relieving Factors: ibuprofen, warm bath/swim  Nutritional Status: BMI of 19-24  Normal Nutritional Risks: None Diabetes: No     Diabetic?no  Interpreter Needed?: No  Information entered by :: Ireland Virrueta Bretta Bang NP   Activities of Daily Living     No data to display          Patient Care Team: Martel Galvan X, NP as PCP - General (Internal Medicine)  Indicate any recent Medical Services you may have received from other than [facility] providers in the past year (date may be approximate).     Assessment:   This is a routine wellness examination for Nancy Conrad.  Hearing/Vision screen No results found.  Dietary issues and exercise activities discussed:     Goals Addressed             This Visit's Progress    Maintain Mobility and Function       Evidence-based guidance:  Emphasize the importance of physical activity and aerobic exercise as included in treatment plan; assess barriers to adherence; consider patient's abilities and preferences.  Encourage gradual increase in activity or exercise instead of stopping if pain occurs.  Reinforce individual therapy exercise prescription, such as strengthening, stabilization and stretching programs.  Promote optimal body mechanics to stabilize the spine with lifting and functional activity.  Encourage activity and mobility modifications to facilitate optimal function, such as using a log roll for bed mobility or dressing from a  seated position.  Reinforce individual adaptive equipment recommendations to limit excessive spinal movements, such as a Systems analyst.  Assess adequacy of sleep; encourage use of sleep hygiene techniques, such as bedtime routine; use of white noise; dark, cool bedroom; avoiding daytime naps, heavy meals or exercise before bedtime.  Promote positions and modification to optimize sleep and sexual activity; consider pillows or positioning devices to assist in maintaining neutral spine.  Explore options for applying ergonomic principles at work and home, such as frequent position changes, using ergonomically designed equipment and working at optimal height.  Promote modifications to increase comfort with driving such as lumbar  support, optimizing seat and steering wheel position, using cruise control and taking frequent rest stops to stretch and walk.   Notes:        Depression Screen    06/20/2022    8:45 AM  PHQ 2/9 Scores  PHQ - 2 Score 0  Exception Documentation Other- indicate reason in comment box  Not completed AWV    Fall Risk    06/20/2022    8:45 AM 03/22/2022    1:41 PM 11/28/2021   11:23 AM  [place] in the past year? 0 0 0  Number falls in past yr: 0 0 0  Injury with Fall? 0 0 0  Risk for fall due to : No Fall Risks No Fall Risks No Fall Risks  Follow up Falls evaluation completed Falls evaluation completed Falls evaluation completed    [place]:  Any stairs in or around the home? Yes  If so, are there any without handrails? No  Home free of loose throw rugs in walkways, pet beds, electrical cords, etc? Yes  Adequate lighting in your home to reduce risk of falls? Yes   ASSISTIVE DEVICES UTILIZED TO PREVENT FALLS:  Life alert? No  Use of a cane, walker or w/c? No  Grab bars in the bathroom? Yes  Shower chair or bench in shower? Yes  Elevated toilet seat or a handicapped toilet? Yes   TIMED UP AND GO:  Was the  test performed? No .    Gait steady and fast without use of assistive device  Cognitive Function:    06/20/2022    1:21 PM  MMSE - Mini Mental State Exam  Orientation to time 5  Orientation to Place 5  Registration 3  Attention/ Calculation 5  Recall 3  Language- name 2 objects 2  Language- repeat 1  Language- follow 3 step command 3  Language- read & follow direction 1  Write a sentence 1  Copy design 1  Total score 30        Immunizations Immunization History  Administered Date(s) Administered   Covid-19, Mrna,Vaccine(Spikevax)11yr and older 01/24/2022   Hepatitis A, Adult 08/31/2014   Influenza, High Dose Seasonal PF 01/18/2015, 02/09/2019, 01/30/2021, 01/19/2022   Influenza,inj,quad, With Preservative 01/20/2018, 01/21/2019   Moderna Covid-19 Vaccine Bivalent Booster 142yr& up 09/07/2021   Moderna Sars-Covid-2 Vaccination 04/26/2019, 05/24/2019, 02/29/2020, 09/19/2020   Pfizer Covid-19 Vaccine Bivalent Booster 1266yr up 01/09/2021   Pneumococcal Conjugate-13 08/31/2014   Pneumococcal Polysaccharide-23 09/04/2020   Rsv, Bivalent, Protein Subunit Rsvpref,pf (AbEvans Lance9/30/2023   Td 06/04/2017   Zoster Recombinat (Shingrix) 08/27/2019, 09/04/2020   Zoster, Live 08/27/2019, 09/04/2020    TDAP status: Up to date  Flu Vaccine status: Up to date  Pneumococcal vaccine status: Up to date  Covid-19 vaccine status: Completed vaccines  Qualifies for Shingles Vaccine? Yes   Zostavax completed Yes   Shingrix Completed?: Yes  Screening Tests Health Maintenance  Topic Date Due   Medicare Annual Wellness (AWV)  06/20/2023   DTaP/Tdap/Td (2 - Tdap) 06/05/2027   Pneumonia Vaccine 65+39ears old  Completed   INFLUENZA VACCINE  Completed   DEXA SCAN  Completed   COVID-19 Vaccine  Completed   Zoster Vaccines- Shingrix  Completed   HPV VACCINES  Aged Out    Health Maintenance  There are no preventive care reminders to display for this patient.  Colorectal  cancer screening: No longer required.   Mammogram status: No longer required  due to aged out.  Bone Density status: Completed 2013. Results reflect: Bone density results: OSTEOPOROSIS. Repeat every 2 years years.  Lung Cancer Screening: (Low Dose CT Chest recommended if Age 27-80 years, 30 pack-year currently smoking OR have quit w/in 15years.) does not qualify.    Additional Screening:  Hepatitis C Screening: does not qualify;  Vision Screening: Recommended annual ophthalmology exams for early detection of glaucoma and other disorders of the eye. Is the patient up to date with their annual eye exam?  Yes  Who is the provider or what is the name of the office in which the patient attends annual eye exams? Dr Prudencio Burly If pt is not established with a provider, would they like to be referred to a provider to establish care? No .   Dental Screening: Recommended annual dental exams for proper oral hygiene  Community Resource Referral / Chronic Care Management: CRR required this visit?  No   CCM required this visit?  No      Plan:     I have personally reviewed and noted the following in the patient's chart:   Medical and social history Use of alcohol, tobacco or illicit drugs  Current medications and supplements including opioid prescriptions. Patient is not currently taking opioid prescriptions. Functional ability and status Nutritional status Physical activity Advanced directives List of other physicians Hospitalizations, surgeries, and ER visits in previous 12 months Vitals Screenings to include cognitive, depression, and falls Referrals and appointments  In addition, I have reviewed and discussed with patient certain preventive protocols, quality metrics, and best practice recommendations. A written personalized care plan for preventive services as well as general preventive health recommendations were provided to patient.     Xavien Dauphinais X Maeve Debord, NP   06/20/2022

## 2022-09-23 DIAGNOSIS — E7800 Pure hypercholesterolemia, unspecified: Secondary | ICD-10-CM | POA: Diagnosis not present

## 2022-09-23 DIAGNOSIS — R7301 Impaired fasting glucose: Secondary | ICD-10-CM | POA: Diagnosis not present

## 2022-09-23 DIAGNOSIS — M810 Age-related osteoporosis without current pathological fracture: Secondary | ICD-10-CM | POA: Diagnosis not present

## 2022-09-23 DIAGNOSIS — Z131 Encounter for screening for diabetes mellitus: Secondary | ICD-10-CM | POA: Diagnosis not present

## 2022-09-30 DIAGNOSIS — Z0000 Encounter for general adult medical examination without abnormal findings: Secondary | ICD-10-CM | POA: Diagnosis not present

## 2022-09-30 DIAGNOSIS — E559 Vitamin D deficiency, unspecified: Secondary | ICD-10-CM | POA: Diagnosis not present

## 2022-09-30 DIAGNOSIS — M810 Age-related osteoporosis without current pathological fracture: Secondary | ICD-10-CM | POA: Diagnosis not present

## 2022-09-30 DIAGNOSIS — R7303 Prediabetes: Secondary | ICD-10-CM | POA: Diagnosis not present

## 2022-09-30 DIAGNOSIS — Z6824 Body mass index (BMI) 24.0-24.9, adult: Secondary | ICD-10-CM | POA: Diagnosis not present

## 2022-09-30 DIAGNOSIS — E7800 Pure hypercholesterolemia, unspecified: Secondary | ICD-10-CM | POA: Diagnosis not present

## 2022-09-30 DIAGNOSIS — I471 Supraventricular tachycardia, unspecified: Secondary | ICD-10-CM | POA: Diagnosis not present

## 2022-10-29 ENCOUNTER — Other Ambulatory Visit: Payer: Medicare PPO

## 2022-10-31 ENCOUNTER — Encounter: Payer: Medicare PPO | Admitting: Nurse Practitioner

## 2022-11-04 DIAGNOSIS — H52203 Unspecified astigmatism, bilateral: Secondary | ICD-10-CM | POA: Diagnosis not present

## 2022-11-04 DIAGNOSIS — Z961 Presence of intraocular lens: Secondary | ICD-10-CM | POA: Diagnosis not present

## 2022-11-04 DIAGNOSIS — H353132 Nonexudative age-related macular degeneration, bilateral, intermediate dry stage: Secondary | ICD-10-CM | POA: Diagnosis not present

## 2023-04-30 DIAGNOSIS — R41841 Cognitive communication deficit: Secondary | ICD-10-CM | POA: Diagnosis not present

## 2023-08-14 ENCOUNTER — Ambulatory Visit: Payer: Self-pay

## 2023-08-14 ENCOUNTER — Ambulatory Visit: Admitting: Nurse Practitioner

## 2023-08-14 VITALS — BP 122/84 | HR 70 | Temp 98.2°F | Ht 61.0 in | Wt 124.0 lb

## 2023-08-14 DIAGNOSIS — D649 Anemia, unspecified: Secondary | ICD-10-CM

## 2023-08-14 DIAGNOSIS — K219 Gastro-esophageal reflux disease without esophagitis: Secondary | ICD-10-CM

## 2023-08-14 DIAGNOSIS — I471 Supraventricular tachycardia, unspecified: Secondary | ICD-10-CM

## 2023-08-14 DIAGNOSIS — R1110 Vomiting, unspecified: Secondary | ICD-10-CM

## 2023-08-14 MED ORDER — ONDANSETRON HCL 4 MG PO TABS: 4.0000 mg | ORAL_TABLET | Freq: Three times a day (TID) | ORAL | 0 refills | Status: DC | PRN

## 2023-08-14 MED ORDER — OMEPRAZOLE 20 MG PO CPDR: 20.0000 mg | DELAYED_RELEASE_CAPSULE | Freq: Every day | ORAL | 2 refills | Status: DC

## 2023-08-14 NOTE — Telephone Encounter (Signed)
 Copied from CRM [id number]. Topic: Clinical - Red Word Triage >> Aug 14, 2023 10:20 AM Retta Caster wrote: Red Word that prompted transfer to Nurse Triage: As of 04/23 diarrhea/nauseated /Vomitting   Chief Complaint: Vomiting  Symptoms: Vomiting last night  Frequency: 3 episodes  Pertinent Negatives: Patient denies diarrhea or other symptoms at this time  Disposition: [] ED /[] Urgent Care (no appt availability in office) / [x] Appointment(In office/virtual)/ []  [place] [facility]/ [] Home Care/ [] Refused Recommended Disposition /[] [place] [facility]/ []  Follow-up with PCP Additional Notes: Nurse Ammon Bales called with the patient stating that last night the patient had 3 episodes of vomiting. She states that the patient has not had any vomiting today and that she has had no other symptoms, but that the patient has not been evaluated for her symptoms yet and wanted to make an appointment for her. Appointment made today for the patient to be evaluated.     Answer Assessment - Initial Assessment Questions 1. VOMITING SEVERITY: "How many times have you vomited in the past 24 hours?"     - MILD:  1 - 2 times/day    - MODERATE: 3 - 5 times/day, decreased oral intake without significant weight loss or symptoms of dehydration    - SEVERE: 6 or more times/day, vomits everything or nearly everything, with significant weight loss, symptoms of dehydration      Moderate last night, none today  2. ONSET: "When did the vomiting begin?"      Last night  4. ABDOMEN PAIN: "Are your having any abdomen pain?" If Yes : "How bad is it and what does it feel like?" (e.g., crampy, dull, intermittent, constant)      No 5. DIARRHEA: "Is there any diarrhea?" If Yes, ask: "How many times today?"      No 6. CONTACTS: "Is there anyone else in the family with the same symptoms?"      No 7. CAUSE: "What do you think is causing your vomiting?"     Unsure  8. HYDRATION STATUS: "Any signs of dehydration?" (e.g., dry mouth [not  only dry lips], too weak to stand) "When did you last urinate?"     No 9. OTHER SYMPTOMS: "Do you have any other symptoms?" (e.g., fever, headache, vertigo, vomiting blood or coffee grounds, recent head injury)     No  Protocols used: Vomiting-A-AH

## 2023-08-14 NOTE — Assessment & Plan Note (Signed)
 Hgb 9.6 03/16/20<<Hgb 13.6 12/04/21

## 2023-08-14 NOTE — Patient Instructions (Addendum)
 Fasting Labs on 08/19/23 at 7 am

## 2023-08-14 NOTE — Telephone Encounter (Signed)
 Reason for Disposition . [1] MODERATE vomiting (e.g., 3 - 5 times/day) AND [2] age > 60 years    No vomiting today, appointment made for patient  Protocols used: Vomiting-A-AH

## 2023-08-14 NOTE — Assessment & Plan Note (Signed)
 Obtain CBC/diff, CMP/eGFR, lipase, Amylase Zofran  prn

## 2023-08-14 NOTE — Telephone Encounter (Signed)
 See Triage Notes from Triage Nurse FYI. Message sent to Mast, Man X, NP

## 2023-08-14 NOTE — Assessment & Plan Note (Signed)
Heart rate is in control, takes Metoprolol.  

## 2023-08-14 NOTE — Assessment & Plan Note (Signed)
 Continue Omeprazole ?

## 2023-08-14 NOTE — Progress Notes (Unsigned)
 Location:   [facility]   Place of Service:    Provider: [facility] Juvia Aerts NP  Garry Bochicchio X, NP  Patient Care Team: Demaris Bousquet X, NP as PCP - General (Internal Medicine)  Extended Emergency Contact Information Primary Emergency Contact: [facility] Address: [address]          [place], [place] [address] [place] Home Phone: [phone] Mobile Phone: [phone] Relation: Daughter Secondary Emergency Contact: McKinney,Linda Address: [address] [place], [place] [address] [place] Home Phone: [phone] Relation: Daughter  Code Status: DNR Goals of care: Advanced Directive information    06/20/2022    8:44 AM  Advanced Directives  Does Patient Have a Medical Advance Directive? Yes  Type of Advance Directive Out of facility DNR (pink MOST or yellow form);Living will  Does patient want to make changes to medical advance directive? No - Patient declined  Pre-existing out of facility DNR order (yellow form or pink MOST form) Pink MOST form placed in chart (order not valid for inpatient use)     Chief Complaint  Patient presents with   Emesis    X 3 episodes last night. Patient with no vomiting today and was able to eat. Patient also c/o a-fib episode last night and needs a new cardiologist    HPI:  Pt is a 89 y.o. female seen today for an acute visit for vomiting   OA, takes prn Tylenol , Norco             Heart rate is in control,  takes Metoprolol .              Anemia, Hgb 9.6 03/16/20<<Hgb 13.6 12/04/21             OP t score -3.4 11/10/19, 12/03/21 t score -3.9, on Vit D, Ca, off Calcitonin.              HLD declined statin due to intolerance in the past. LDL 181 12/04/21             GERD, takes Omeprazole    Past Medical History:  Diagnosis Date   Arthritis    Dyspnea    with exertion   History of kidney stones    1962   SVT (supraventricular tachycardia)    Past Surgical History:  Procedure Laterality Date   TONSILLECTOMY      TOTAL HIP ARTHROPLASTY Left 03/15/2020   Procedure: TOTAL HIP ARTHROPLASTY ANTERIOR APPROACH;  Surgeon: Liliane Rei, MD;  Location: [facility];  Service: Orthopedics;  Laterality: Left;    VAGINAL DELIVERY     x4    Allergies  Allergen Reactions   Codeine Other (See Comments)    Allergies as of 08/14/2023       Reactions   Codeine Other (See Comments)        Medication List        Accurate as of August 14, 2023 11:59 PM. If you have any questions, ask your nurse or doctor.          acetaminophen  500 MG tablet Commonly known as: TYLENOL  Take 1,000 mg by mouth every 6 (six) hours as needed for mild pain.   Glucosamine Sulfate 1000 MG Caps Take 1,000 mg by mouth in the morning and at bedtime.   ibuprofen 200 MG tablet Commonly known as: ADVIL Take 200 mg by mouth as needed.   Lutein-Zeaxanthin 25-5 MG Caps Take 1  capsule by mouth daily.   metoprolol  tartrate 25 MG tablet Commonly known as: LOPRESSOR  TAKE 1 TABLET TWICE DAILY.   omeprazole  20 MG capsule Commonly known as: PRILOSEC Take 1 capsule (20 mg total) by mouth daily.   ondansetron  4 MG tablet Commonly known as: Zofran  Take 1 tablet (4 mg total) by mouth every 8 (eight) hours as needed for nausea or vomiting. Started by: Kambryn Dapolito X Holt Woolbright   VITAMIN B COMPLEX PO Take 1 tablet by mouth every Monday, Wednesday, and Friday.   Vitamin D  50 MCG (2000 UT) tablet Take 2,000 Units by mouth daily.        Review of Systems  Immunization History  Administered Date(s) Administered   Hepatitis A, Adult 08/31/2014   Influenza, High Dose Seasonal PF 01/18/2015, 02/09/2019, 01/30/2021, 01/19/2022   Influenza,inj,quad, With Preservative 01/20/2018, 01/21/2019   Moderna Covid-19 Fall Seasonal Vaccine 66yrs & older 01/24/2022   Moderna Covid-19 Vaccine Bivalent Booster 50yrs & up 09/07/2021   Moderna Sars-Covid-2 Vaccination 04/26/2019, 05/24/2019, 02/29/2020, 09/19/2020   Pfizer Covid-19 Vaccine Bivalent  Booster 25yrs & up 01/09/2021   Pneumococcal Conjugate-13 08/31/2014   Pneumococcal Polysaccharide-23 09/04/2020   Rsv, Bivalent, Protein Subunit Rsvpref,pf Pattricia Bores) 01/19/2022   Td 06/04/2017   Zoster Recombinant(Shingrix) 08/27/2019, 09/04/2020   Zoster, Live 08/27/2019, 09/04/2020   Pertinent  Health Maintenance Due  Topic Date Due   INFLUENZA VACCINE  11/21/2023   DEXA SCAN  Completed      03/15/2020    7:05 PM 03/16/2020    9:20 AM 11/28/2021   11:23 AM 03/22/2022    1:41 PM 06/20/2022    8:45 AM  Fall Risk  Falls in the past year?   0 0 0  Was there an injury with Fall?   0 0 0  Fall Risk Category Calculator   0 0 0  Fall Risk Category (Retired)   Low Low   (RETIRED) Patient Fall Risk Level High fall risk High fall risk Low fall risk Low fall risk   Patient at Risk for Falls Due to   No Fall Risks No Fall Risks No Fall Risks  Fall risk Follow up   Falls evaluation completed Falls evaluation completed Falls evaluation completed   Functional Status Survey:    Vitals:   08/14/23 1415  BP: 122/84  Pulse: 70  Temp: 98.2 F (36.8 C)  SpO2: 95%  Weight: 124 lb (56.2 kg)  Height: 5\' 1"  (1.549 m)   Body mass index is 23.43 kg/m. Physical Exam  Labs reviewed: No results for input(s): "NA", "K", "CL", "CO2", "GLUCOSE", "BUN", "CREATININE", "CALCIUM", "MG", "PHOS" in the last 8760 hours. No results for input(s): "AST", "ALT", "ALKPHOS", "BILITOT", "PROT", "ALBUMIN " in the last 8760 hours. No results for input(s): "WBC", "NEUTROABS", "HGB", "HCT", "MCV", "PLT" in the last 8760 hours. Lab Results  Component Value Date   TSH 0.48 12/04/2021   Lab Results  Component Value Date   HGBA1C 5.6 12/04/2021   Lab Results  Component Value Date   CHOL 277 (H) 12/04/2021   HDL 70 12/04/2021   LDLCALC 181 (H) 12/04/2021   TRIG 128 12/04/2021   CHOLHDL 4.0 12/04/2021    Significant Diagnostic Results in last 30 days:  No results found.  Assessment/Plan: Vomiting Obtain  CBC/diff, CMP/eGFR, lipase, Amylase Zofran  prn  SVT (supraventricular tachycardia) (HCC) Heart rate is in control,  takes Metoprolol .   Anemia Hgb 9.6 03/16/20<<Hgb 13.6 12/04/21  GERD (gastroesophageal reflux disease) Continue Omeprazole      Family/ staff Communication:  plan of care reviewed with the patient   Labs/tests ordered:  CBC/diff, CMP/eGFR, lipase, Amylase

## 2023-08-15 ENCOUNTER — Encounter: Payer: Self-pay | Admitting: Nurse Practitioner

## 2023-08-19 DIAGNOSIS — R1110 Vomiting, unspecified: Secondary | ICD-10-CM | POA: Diagnosis not present

## 2023-08-20 ENCOUNTER — Encounter: Payer: Self-pay | Admitting: Nurse Practitioner

## 2023-08-20 ENCOUNTER — Other Ambulatory Visit: Payer: Self-pay | Admitting: Nurse Practitioner

## 2023-08-20 DIAGNOSIS — R7989 Other specified abnormal findings of blood chemistry: Secondary | ICD-10-CM

## 2023-08-23 LAB — COMPREHENSIVE METABOLIC PANEL WITH GFR
AG Ratio: 1.8 (calc) (ref 1.0–2.5)
ALT: 11 U/L (ref 6–29)
AST: 14 U/L (ref 10–35)
Albumin: 4.2 g/dL (ref 3.6–5.1)
Alkaline phosphatase (APISO): 69 U/L (ref 37–153)
BUN: 15 mg/dL (ref 7–25)
CO2: 29 mmol/L (ref 20–32)
Calcium: 9 mg/dL (ref 8.6–10.4)
Chloride: 101 mmol/L (ref 98–110)
Creat: 0.62 mg/dL (ref 0.60–0.95)
Globulin: 2.3 g/dL (ref 1.9–3.7)
Glucose, Bld: 96 mg/dL (ref 65–99)
Potassium: 3.6 mmol/L (ref 3.5–5.3)
Sodium: 140 mmol/L (ref 135–146)
Total Bilirubin: 1 mg/dL (ref 0.2–1.2)
Total Protein: 6.5 g/dL (ref 6.1–8.1)
eGFR: 85 mL/min/{1.73_m2} (ref >=60)

## 2023-08-23 LAB — CBC WITH DIFFERENTIAL/PLATELET
Absolute Lymphocytes: 940 {cells}/uL (ref 850–3900)
Absolute Monocytes: 830 {cells}/uL (ref 200–950)
Basophils Absolute: 40 {cells}/uL (ref 0–200)
Basophils Relative: 0.5 %
Eosinophils Absolute: 371 {cells}/uL (ref 15–500)
Eosinophils Relative: 4.7 %
HCT: 37.6 % (ref 35.0–45.0)
Hemoglobin: 12.5 g/dL (ref 11.7–15.5)
MCH: 30.9 pg (ref 27.0–33.0)
MCHC: 33.2 g/dL (ref 32.0–36.0)
MCV: 92.8 fL (ref 80.0–100.0)
MPV: 9.6 fL (ref 7.5–12.5)
Monocytes Relative: 10.5 %
Neutro Abs: 5720 {cells}/uL (ref 1500–7800)
Neutrophils Relative %: 72.4 %
Platelets: 193 10*3/uL (ref 140–400)
RBC: 4.05 10*6/uL (ref 3.80–5.10)
RDW: 11.7 % (ref 11.0–15.0)
Total Lymphocyte: 11.9 %
WBC: 7.9 10*3/uL (ref 3.8–10.8)

## 2023-08-23 LAB — VITAMIN D 1,25 DIHYDROXY
Vitamin D 1, 25 (OH)2 Total: 53 pg/mL (ref 18–72)
Vitamin D2 1, 25 (OH)2: <8 pg/mL
Vitamin D3 1, 25 (OH)2: 53 pg/mL

## 2023-08-23 LAB — TSH: TSH: 0.16 m[IU]/L — ABNORMAL LOW (ref 0.40–4.50)

## 2023-08-23 LAB — VITAMIN B12: Vitamin B-12: 505 pg/mL (ref 200–1100)

## 2023-08-23 LAB — LIPID PANEL
Cholesterol: 222 mg/dL — ABNORMAL HIGH (ref <200)
HDL: 60 mg/dL (ref >=50)
LDL Cholesterol (Calc): 144 mg/dL — ABNORMAL HIGH
Non-HDL Cholesterol (Calc): 162 mg/dL — ABNORMAL HIGH (ref <130)
Total CHOL/HDL Ratio: 3.7 (calc) (ref <5.0)
Triglycerides: 80 mg/dL (ref <150)

## 2023-08-23 LAB — LIPASE: Lipase: 12 U/L (ref 7–60)

## 2023-08-23 LAB — AMYLASE: Amylase: 34 U/L (ref 21–101)

## 2023-08-26 DIAGNOSIS — R7989 Other specified abnormal findings of blood chemistry: Secondary | ICD-10-CM | POA: Diagnosis not present

## 2023-08-26 LAB — T3, FREE: T3, Free: 3.6 pg/mL (ref 2.3–4.2)

## 2023-08-26 LAB — T4, FREE: Free T4: 1.6 ng/dL (ref 0.8–1.8)

## 2023-08-27 NOTE — Progress Notes (Signed)
 T3 and free T4 normal

## 2023-11-07 DIAGNOSIS — H353132 Nonexudative age-related macular degeneration, bilateral, intermediate dry stage: Secondary | ICD-10-CM | POA: Diagnosis not present

## 2023-11-07 DIAGNOSIS — H52203 Unspecified astigmatism, bilateral: Secondary | ICD-10-CM | POA: Diagnosis not present

## 2023-11-07 DIAGNOSIS — Z961 Presence of intraocular lens: Secondary | ICD-10-CM | POA: Diagnosis not present

## 2023-11-07 DIAGNOSIS — H04123 Dry eye syndrome of bilateral lacrimal glands: Secondary | ICD-10-CM | POA: Diagnosis not present

## 2023-11-24 ENCOUNTER — Other Ambulatory Visit: Payer: Self-pay | Admitting: Nurse Practitioner

## 2023-11-24 DIAGNOSIS — I4719 Other supraventricular tachycardia: Secondary | ICD-10-CM

## 2024-01-22 ENCOUNTER — Non-Acute Institutional Stay: Admitting: Nurse Practitioner

## 2024-01-22 ENCOUNTER — Encounter: Payer: Self-pay | Admitting: Nurse Practitioner

## 2024-01-22 VITALS — BP 126/72 | HR 72 | Temp 98.1°F | Wt 120.2 lb

## 2024-01-22 DIAGNOSIS — R7989 Other specified abnormal findings of blood chemistry: Secondary | ICD-10-CM

## 2024-01-22 DIAGNOSIS — K219 Gastro-esophageal reflux disease without esophagitis: Secondary | ICD-10-CM | POA: Diagnosis not present

## 2024-01-22 DIAGNOSIS — I471 Supraventricular tachycardia, unspecified: Secondary | ICD-10-CM

## 2024-01-22 DIAGNOSIS — I4719 Other supraventricular tachycardia: Secondary | ICD-10-CM

## 2024-01-22 DIAGNOSIS — E785 Hyperlipidemia, unspecified: Secondary | ICD-10-CM

## 2024-01-22 DIAGNOSIS — M810 Age-related osteoporosis without current pathological fracture: Secondary | ICD-10-CM | POA: Diagnosis not present

## 2024-01-22 DIAGNOSIS — D649 Anemia, unspecified: Secondary | ICD-10-CM | POA: Diagnosis not present

## 2024-01-22 MED ORDER — OMEPRAZOLE 20 MG PO CPDR: 20.0000 mg | DELAYED_RELEASE_CAPSULE | Freq: Every day | ORAL | 2 refills | Status: AC

## 2024-01-22 MED ORDER — METOPROLOL TARTRATE 25 MG PO TABS: 25.0000 mg | ORAL_TABLET | Freq: Two times a day (BID) | ORAL | 3 refills | Status: DC

## 2024-01-22 NOTE — Assessment & Plan Note (Addendum)
 t score -3.4 11/10/19, 12/03/21 t score -3.9, on Vit D, Ca, off Calcitonin. the patient declined DEXA and possible tx.

## 2024-01-22 NOTE — Assessment & Plan Note (Signed)
Heart rate is in control, takes Metoprolol.  

## 2024-01-22 NOTE — Assessment & Plan Note (Signed)
takes prn Tylenol, Norco

## 2024-01-22 NOTE — Assessment & Plan Note (Addendum)
 declined statin due to intolerance in the past. LDL 144 08/19/23

## 2024-01-22 NOTE — Assessment & Plan Note (Signed)
 Hgb 12.5 08/19/23

## 2024-01-22 NOTE — Assessment & Plan Note (Signed)
 Low TSH 0.16 08/19/23, repeat TSH 01/27/24

## 2024-01-22 NOTE — Patient Instructions (Signed)
 TSH 01/27/24 F/u 6 months, labs prior

## 2024-01-22 NOTE — Progress Notes (Signed)
 Location:   Clinic [facility]   Place of Service:  Clinic (12) Provider: [facility] Jeneane Pieczynski NP  Jaileigh Weimer X, NP  Patient Care Team: Cleavon Goldman X, NP as PCP - General (Internal Medicine)  Extended Emergency Contact Information Primary Emergency Contact: Mathes,Terri Address: [address]          [place], [place] [address] [place] Home Phone: [phone] Mobile Phone: [phone] Relation: Daughter Secondary Emergency Contact: McKinney,Linda Address: [address]          [place], [place] [address] [place] Home Phone: [phone] Relation: Daughter  Code Status:  DNR Goals of care: Advanced Directive information    06/20/2022    8:44 AM  Advanced Directives  Does Patient Have a Medical Advance Directive? Yes  Type of Advance Directive Out of facility DNR (pink MOST or yellow form);Living will  Does patient want to make changes to medical advance directive? No - Patient declined  Pre-existing out of facility DNR order (yellow form or pink MOST form) Pink MOST form placed in chart (order not valid for inpatient use)     Chief Complaint  Patient presents with   Medical Management of Chronic Issues    4-5 month follow up      HPI:  Pt is a 89 y.o. female seen today for medical management of chronic diseases.    OA, takes prn Tylenol , Norco             Heart rate is in control,  takes Metoprolol .              Anemia, Hgb 12.5 08/19/23             OP t score -3.4 11/10/19, 12/03/21 t score -3.9, on Vit D, Ca, off Calcitonin. the patient declined DEXA and possible tx.              HLD declined statin due to intolerance in the past. LDL 144 08/19/23             GERD, takes Omeprazole   Low TSH 0.16 08/19/23    Past Medical History:  Diagnosis Date   Arthritis    Dyspnea    with exertion   History of kidney stones    1962   SVT (supraventricular tachycardia)    Past Surgical History:  Procedure Laterality Date   TONSILLECTOMY     TOTAL HIP  ARTHROPLASTY Left 03/15/2020   Procedure: TOTAL HIP ARTHROPLASTY ANTERIOR APPROACH;  Surgeon: Melodi Lerner, MD;  Location: [facility];  Service: Orthopedics;  Laterality: Left;    VAGINAL DELIVERY     x4    Allergies  Allergen Reactions   Codeine Other (See Comments)    Allergies as of 01/22/2024       Reactions   Codeine Other (See Comments)        Medication List        Accurate as of January 22, 2024  2:31 PM. If you have any questions, ask your nurse or doctor.          STOP taking these medications    Glucosamine Sulfate 1000 MG Caps Stopped by: Cyndy Braver X Dalon Reichart       TAKE these medications    acetaminophen  500 MG tablet Commonly known as: TYLENOL  Take 1,000 mg by mouth every 6 (six) hours as needed for mild pain.   ibuprofen 200 MG tablet Commonly known as: ADVIL Take 200 mg by mouth as needed.  Lutein-Zeaxanthin 25-5 MG Caps Take 1 capsule by mouth daily.   metoprolol  tartrate 25 MG tablet Commonly known as: LOPRESSOR  Take 1 tablet (25 mg total) by mouth 2 (two) times daily with a meal.   omeprazole  20 MG capsule Commonly known as: PRILOSEC Take 1 capsule (20 mg total) by mouth daily.   ondansetron  4 MG tablet Commonly known as: Zofran  Take 1 tablet (4 mg total) by mouth every 8 (eight) hours as needed for nausea or vomiting.   VITAMIN B COMPLEX PO Take 1 tablet by mouth every Monday, Wednesday, and Friday.   Vitamin D  50 MCG (2000 UT) tablet Take 2,000 Units by mouth daily.        Review of Systems  Constitutional:  Negative for activity change, fatigue and fever.  HENT:  Negative for congestion and trouble swallowing.   Eyes:  Negative for visual disturbance.  Respiratory:  Negative for cough, chest tightness and shortness of breath.   Cardiovascular:  Negative for chest pain, palpitations and leg swelling.  Gastrointestinal:  Negative for abdominal pain and constipation.  Endocrine: Negative for heat intolerance.  Genitourinary:   Negative for dysuria and urgency.       Urinary leakage, chronic  Musculoskeletal:  Positive for arthralgias. Negative for gait problem.  Skin:  Negative for color change.  Neurological:  Negative for speech difficulty and headaches.  Psychiatric/Behavioral:  Negative for confusion and sleep disturbance. The patient is not nervous/anxious.     Immunization History  Administered Date(s) Administered    sv, Bivalent, Protein Subunit Rsvpref,pf Marlow) 01/19/2022   Hepatitis A, Adult 08/31/2014   INFLUENZA, HIGH DOSE SEASONAL PF 01/18/2015, 02/09/2019, 01/30/2021, 01/19/2022   Influenza,inj,quad, With Preservative 01/20/2018, 01/21/2019   Moderna Covid-19 Fall Seasonal Vaccine 68yrs & older 01/24/2022   Moderna Covid-19 Vaccine Bivalent Booster 4yrs & up 09/07/2021   Moderna Sars-Covid-2 Vaccination 04/26/2019, 05/24/2019, 02/29/2020, 09/19/2020   Pfizer Covid-19 Vaccine Bivalent Booster 78yrs & up 01/09/2021   Pneumococcal Conjugate-13 08/31/2014   Pneumococcal Polysaccharide-23 09/04/2020   Td 06/04/2017   Zoster Recombinant(Shingrix) 08/27/2019, 09/04/2020   Zoster, Live 08/27/2019, 09/04/2020   Pertinent  Health Maintenance Due  Topic Date Due   Influenza Vaccine  11/21/2023   DEXA SCAN  Completed      03/15/2020    7:05 PM 03/16/2020    9:20 AM 11/28/2021   11:23 AM 03/22/2022    1:41 PM 06/20/2022    8:45 AM  Fall Risk  Falls in the past year?   0 0 0  Was there an injury with Fall?   0 0 0  Fall Risk Category Calculator   0 0 0  Fall Risk Category (Retired)   Low  Low    (RETIRED) Patient Fall Risk Level High fall risk  High fall risk  Low fall risk  Low fall risk    Patient at Risk for Falls Due to   No Fall Risks No Fall Risks No Fall Risks  Fall risk Follow up   Falls evaluation completed  Falls evaluation completed  Falls evaluation completed     Data saved with a previous flowsheet row definition   Functional Status Survey:    Vitals:   01/22/24 1315  BP:  126/72  Pulse: 72  Temp: 98.1 F (36.7 C)  SpO2: 98%  Weight: 120 lb 3.2 oz (54.5 kg)   Body mass index is 22.71 kg/m. Physical Exam Vitals and nursing note reviewed.  Constitutional:      Appearance: Normal appearance.  HENT:     Head: Normocephalic and atraumatic.     Nose: Nose normal.     Mouth/Throat:     Mouth: Mucous membranes are moist.  Eyes:     Extraocular Movements: Extraocular movements intact.     Conjunctiva/sclera: Conjunctivae normal.     Pupils: Pupils are equal, round, and reactive to light.  Cardiovascular:     Rate and Rhythm: Normal rate.     Heart sounds: No murmur heard. Pulmonary:     Effort: Pulmonary effort is normal.     Breath sounds: Rales present.     Comments: Bibasilar rales.  Abdominal:     General: Bowel sounds are normal.     Palpations: Abdomen is soft.     Tenderness: There is no abdominal tenderness.  Genitourinary:    Comments: Hemorrhoids  Musculoskeletal:        General: Normal range of motion.     Cervical back: Normal range of motion and neck supple.     Right lower leg: No edema.     Left lower leg: No edema.  Skin:    General: Skin is warm and dry.  Neurological:     General: No focal deficit present.     Mental Status: She is alert and oriented to person, place, and time. Mental status is at baseline.     Motor: No weakness.     Gait: Gait normal.  Psychiatric:        Mood and Affect: Mood normal.        Behavior: Behavior normal.        Thought Content: Thought content normal.        Judgment: Judgment normal.     Labs reviewed: Recent Labs    08/19/23 0717  NA 140  K 3.6  CL 101  CO2 29  GLUCOSE 96  BUN 15  CREATININE 0.62  CALCIUM 9.0   Recent Labs    08/19/23 0717  AST 14  ALT 11  BILITOT 1.0  PROT 6.5   Recent Labs    08/19/23 0717  WBC 7.9  NEUTROABS 5,720  HGB 12.5  HCT 37.6  MCV 92.8  PLT 193   Lab Results  Component Value Date   TSH 0.16 (L) 08/19/2023   Lab Results   Component Value Date   HGBA1C 5.6 12/04/2021   Lab Results  Component Value Date   CHOL 222 (H) 08/19/2023   HDL 60 08/19/2023   LDLCALC 144 (H) 08/19/2023   TRIG 80 08/19/2023   CHOLHDL 3.7 08/19/2023    Significant Diagnostic Results in last 30 days:  No results found.  Assessment/Plan  GERD (gastroesophageal reflux disease) Stable,  takes Omeprazole   Hyperlipidemia  declined statin due to intolerance in the past. LDL 144 08/19/23  Osteoporosis  t score -3.4 11/10/19, 12/03/21 t score -3.9, on Vit D, Ca, off Calcitonin. the patient declined DEXA and possible tx.   Low TSH level Low TSH 0.16 08/19/23, repeat TSH 01/27/24  Anemia  Hgb 12.5 08/19/23  SVT (supraventricular tachycardia) Heart rate is in control,  takes Metoprolol .   OA (osteoarthritis) of hip takes prn Tylenol , Norco   Family/ staff Communication: plan of care reviewed with the patient and charge nurse.   Labs/tests ordered: TSH 01/27/24

## 2024-01-22 NOTE — Assessment & Plan Note (Signed)
 Stable,  takes Omeprazole

## 2024-01-27 DIAGNOSIS — K219 Gastro-esophageal reflux disease without esophagitis: Secondary | ICD-10-CM | POA: Diagnosis not present

## 2024-01-27 DIAGNOSIS — M810 Age-related osteoporosis without current pathological fracture: Secondary | ICD-10-CM | POA: Diagnosis not present

## 2024-01-27 DIAGNOSIS — E785 Hyperlipidemia, unspecified: Secondary | ICD-10-CM | POA: Diagnosis not present

## 2024-01-30 LAB — COMPLETE METABOLIC PANEL WITHOUT GFR
AG Ratio: 1.8 (calc) (ref 1.0–2.5)
ALT: 12 U/L (ref 6–29)
AST: 15 U/L (ref 10–35)
Albumin: 4.4 g/dL (ref 3.6–5.1)
Alkaline phosphatase (APISO): 86 U/L (ref 37–153)
BUN: 14 mg/dL (ref 7–25)
CO2: 30 mmol/L (ref 20–32)
Calcium: 9.7 mg/dL (ref 8.6–10.4)
Chloride: 99 mmol/L (ref 98–110)
Creat: 0.63 mg/dL (ref 0.60–0.95)
Globulin: 2.4 g/dL (ref 1.9–3.7)
Glucose, Bld: 86 mg/dL (ref 65–99)
Potassium: 3.8 mmol/L (ref 3.5–5.3)
Sodium: 139 mmol/L (ref 135–146)
Total Bilirubin: 0.6 mg/dL (ref 0.2–1.2)
Total Protein: 6.8 g/dL (ref 6.1–8.1)

## 2024-01-30 LAB — LIPID PANEL
Cholesterol: 240 mg/dL — ABNORMAL HIGH (ref <200)
HDL: 55 mg/dL (ref >=50)
LDL Cholesterol (Calc): 158 mg/dL — ABNORMAL HIGH
Non-HDL Cholesterol (Calc): 185 mg/dL — ABNORMAL HIGH (ref <130)
Total CHOL/HDL Ratio: 4.4 (calc) (ref <5.0)
Triglycerides: 144 mg/dL (ref <150)

## 2024-01-30 LAB — CBC WITH DIFFERENTIAL/PLATELET
Absolute Lymphocytes: 1548 {cells}/uL (ref 850–3900)
Absolute Monocytes: 760 {cells}/uL (ref 200–950)
Basophils Absolute: 28 {cells}/uL (ref 0–200)
Basophils Relative: 0.4 %
Eosinophils Absolute: 312 {cells}/uL (ref 15–500)
Eosinophils Relative: 4.4 %
HCT: 39.1 % (ref 35.0–45.0)
Hemoglobin: 12.6 g/dL (ref 11.7–15.5)
MCH: 30.2 pg (ref 27.0–33.0)
MCHC: 32.2 g/dL (ref 32.0–36.0)
MCV: 93.8 fL (ref 80.0–100.0)
MPV: 9.4 fL (ref 7.5–12.5)
Monocytes Relative: 10.7 %
Neutro Abs: 4452 {cells}/uL (ref 1500–7800)
Neutrophils Relative %: 62.7 %
Platelets: 212 Thousand/uL (ref 140–400)
RBC: 4.17 Million/uL (ref 3.80–5.10)
RDW: 12.6 % (ref 11.0–15.0)
Total Lymphocyte: 21.8 %
WBC: 7.1 Thousand/uL (ref 3.8–10.8)

## 2024-01-30 LAB — HEMOGLOBIN A1C
Hgb A1c MFr Bld: 6 % — ABNORMAL HIGH (ref <5.7)
Mean Plasma Glucose: 126 mg/dL
eAG (mmol/L): 7 mmol/L

## 2024-01-30 LAB — VITAMIN D 1,25 DIHYDROXY
Vitamin D 1, 25 (OH)2 Total: 36 pg/mL (ref 18–72)
Vitamin D2 1, 25 (OH)2: <8 pg/mL
Vitamin D3 1, 25 (OH)2: 36 pg/mL

## 2024-01-30 LAB — TSH: TSH: 0.24 m[IU]/L — ABNORMAL LOW (ref 0.40–4.50)

## 2024-02-24 ENCOUNTER — Ambulatory Visit: Payer: Self-pay | Admitting: Nurse Practitioner

## 2024-02-26 ENCOUNTER — Non-Acute Institutional Stay: Admitting: Nurse Practitioner

## 2024-02-26 ENCOUNTER — Encounter: Payer: Self-pay | Admitting: Nurse Practitioner

## 2024-02-26 VITALS — BP 124/78 | HR 65 | Temp 97.6°F | Resp 17 | Ht 61.0 in | Wt 120.8 lb

## 2024-02-26 DIAGNOSIS — R7989 Other specified abnormal findings of blood chemistry: Secondary | ICD-10-CM

## 2024-02-26 DIAGNOSIS — I471 Supraventricular tachycardia, unspecified: Secondary | ICD-10-CM

## 2024-02-26 DIAGNOSIS — E785 Hyperlipidemia, unspecified: Secondary | ICD-10-CM

## 2024-02-26 DIAGNOSIS — I4719 Other supraventricular tachycardia: Secondary | ICD-10-CM

## 2024-02-26 DIAGNOSIS — R42 Dizziness and giddiness: Secondary | ICD-10-CM | POA: Diagnosis not present

## 2024-02-26 DIAGNOSIS — K219 Gastro-esophageal reflux disease without esophagitis: Secondary | ICD-10-CM | POA: Diagnosis not present

## 2024-02-26 DIAGNOSIS — R7303 Prediabetes: Secondary | ICD-10-CM

## 2024-02-26 MED ORDER — METOPROLOL TARTRATE 25 MG PO TABS: 12.5000 mg | ORAL_TABLET | Freq: Two times a day (BID) | ORAL | Status: AC

## 2024-02-26 NOTE — Patient Instructions (Addendum)
 1.Schedule Annual Wellness Visit. 2. F/u in clinic [facility] 3 months.

## 2024-02-26 NOTE — Assessment & Plan Note (Addendum)
 Low TSH 0.16 08/19/23. 0.24 01/27/24,  declined endocrinology referral.

## 2024-02-26 NOTE — Assessment & Plan Note (Signed)
 Hgb A1c 6.0 01/27/24, diet modification.

## 2024-02-26 NOTE — Progress Notes (Unsigned)
 Location:   Clinic [facility]   Place of Service:    Provider: [facility] Jahira Swiss NP  Kadeen Sroka X, NP  Patient Care Team: Ajai Harville X, NP as PCP - General (Internal Medicine)  Extended Emergency Contact Information Primary Emergency Contact: [facility] Address: [address]          [place], [place] [address] [place] Home Phone: [phone] Mobile Phone: [phone] Relation: Daughter Secondary Emergency Contact: McKinney,Linda Address: [address] [place], [place] [address] [place] Home Phone: [phone] Relation: Daughter  Code Status:  DNR Goals of care: Advanced Directive information    02/26/2024    2:32 PM  Advanced Directives  Does Patient Have a Medical Advance Directive? Yes  Type of Advance Directive Living will;Out of facility DNR (pink MOST or yellow form)  Does patient want to make changes to medical advance directive? No - Patient declined  Pre-existing out of facility DNR order (yellow form or pink MOST form) Pink MOST/Yellow Form most recent copy in chart - Physician notified to receive inpatient order     Chief Complaint  Patient presents with  . Weakness    HPI:  Pt is a 89 y.o. female seen today for medical management of chronic diseases.    Generalized weakness, feeling dizzy when head leaning down, denied chest pain, palpitation, SOB, nausea, vomiting. 01/27/24 labs unremarkable.   OA, takes prn Tylenol , Norco             Heart rate is in control,  takes Metoprolol .              Anemia, Hgb 12.6 01/27/24             OP t score -3.4 11/10/19, 12/03/21 t score -3.9, on Vit D, Ca, off Calcitonin. the patient declined DEXA and possible tx.              HLD declined statin due to intolerance in the past. LDL 158 01/27/24             GERD, takes Omeprazole   Prediabetes, Hgb A1c 6.0 01/27/24, diet modification.              Low TSH 0.16 08/19/23. 0.24 01/27/24   Past Medical History:  Diagnosis Date  . Arthritis   .  Dyspnea    with exertion  . History of kidney stones    1962  . SVT (supraventricular tachycardia)    Past Surgical History:  Procedure Laterality Date  . TONSILLECTOMY    . TOTAL HIP ARTHROPLASTY Left 03/15/2020   Procedure: TOTAL HIP ARTHROPLASTY ANTERIOR APPROACH;  Surgeon: Melodi Lerner, MD;  Location: [facility];  Service: Orthopedics;  Laterality: Left;   . VAGINAL DELIVERY     x4    Allergies  Allergen Reactions  . Codeine Other (See Comments)    Allergies as of 02/26/2024       Reactions   Codeine Other (See Comments)        Medication List        Accurate as of February 26, 2024 11:59 PM. If you have any questions, ask your nurse or doctor.          STOP taking these medications    ondansetron  4 MG tablet Commonly known as: Zofran  Stopped by: Coreyon Nicotra X Tremeka Helbling       TAKE these medications    acetaminophen  500 MG tablet Commonly known  as: TYLENOL  Take 1,000 mg by mouth every 6 (six) hours as needed for mild pain.   ibuprofen 200 MG tablet Commonly known as: ADVIL Take 200 mg by mouth as needed.   Lutein-Zeaxanthin 25-5 MG Caps Take 1 capsule by mouth daily.   metoprolol  tartrate 25 MG tablet Commonly known as: LOPRESSOR  Take 0.5 tablets (12.5 mg total) by mouth 2 (two) times daily with a meal. What changed: how much to take Changed by: Ibrahem Volkman X Santita Hunsberger   omeprazole  20 MG capsule Commonly known as: PRILOSEC Take 1 capsule (20 mg total) by mouth daily.   VITAMIN B COMPLEX PO Take 1 tablet by mouth every Monday, Wednesday, and Friday.   Vitamin D  50 MCG (2000 UT) tablet Take 2,000 Units by mouth daily.        Review of Systems  Constitutional:  Positive for fatigue. Negative for activity change and fever.  HENT:  Negative for congestion and trouble swallowing.   Eyes:  Negative for visual disturbance.  Respiratory:  Negative for cough, chest tightness and shortness of breath.   Cardiovascular:  Negative for chest pain, palpitations and  leg swelling.  Gastrointestinal:  Negative for abdominal pain and constipation.  Endocrine: Negative for heat intolerance.  Genitourinary:  Negative for dysuria and urgency.       Urinary leakage, chronic  Musculoskeletal:  Positive for arthralgias. Negative for gait problem.  Skin:  Negative for color change.  Neurological:  Positive for dizziness. Negative for speech difficulty, weakness and headaches.  Psychiatric/Behavioral:  Negative for confusion and sleep disturbance. The patient is not nervous/anxious.     Immunization History  Administered Date(s) Administered  .  sv, Bivalent, Protein Subunit Rsvpref,pf Marlow) 01/19/2022  . Hepatitis A, Adult 08/31/2014  . INFLUENZA, HIGH DOSE SEASONAL PF 01/18/2015, 02/09/2019, 01/30/2021, 01/19/2022  . Influenza,inj,quad, With Preservative 01/20/2018, 01/21/2019  . Influenza-Unspecified 02/05/2024  . Moderna Covid-19 Fall Seasonal Vaccine 39yrs & older 01/24/2022  . Moderna Covid-19 Vaccine Bivalent Booster 71yrs & up 09/07/2021  . Moderna Sars-Covid-2 Vaccination 04/26/2019, 05/24/2019, 02/29/2020, 09/19/2020  . Pfizer Covid-19 Vaccine Bivalent Booster 3yrs & up 01/09/2021  . Pneumococcal Conjugate-13 08/31/2014  . Pneumococcal Polysaccharide-23 09/04/2020  . Td 06/04/2017  . Unspecified SARS-COV-2 Vaccination 01/20/2024  . Zoster Recombinant(Shingrix) 08/27/2019, 09/04/2020  . Zoster, Live 08/27/2019, 09/04/2020   Pertinent  Health Maintenance Due  Topic Date Due  . Influenza Vaccine  Completed  . DEXA SCAN  Completed      03/16/2020    9:20 AM 11/28/2021   11:23 AM 03/22/2022    1:41 PM 06/20/2022    8:45 AM 02/26/2024    2:31 PM  Fall Risk  Falls in the past year?  0 0 0 1  Was there an injury with Fall?  0 0 0 0  Fall Risk Category Calculator  0 0 0 1  Fall Risk Category (Retired)  Low  Low     (RETIRED) Patient Fall Risk Level High fall risk  Low fall risk  Low fall risk     Patient at Risk for Falls Due to  No Fall  Risks No Fall Risks No Fall Risks No Fall Risks  Fall risk Follow up  Falls evaluation completed  Falls evaluation completed  Falls evaluation completed Falls evaluation completed     Data saved with a previous flowsheet row definition   Functional Status Survey:    Vitals:   02/26/24 1433  BP: 124/78  Pulse: 65  Resp: 17  Temp: 97.6 F (36.4  C)  SpO2: 95%  Weight: 120 lb 12.8 oz (54.8 kg)  Height: 5' 1 (1.549 m)   Body mass index is 22.82 kg/m. Physical Exam Vitals and nursing note reviewed.  Constitutional:      Appearance: Normal appearance.  HENT:     Head: Normocephalic and atraumatic.     Nose: Nose normal.     Mouth/Throat:     Mouth: Mucous membranes are moist.  Eyes:     Extraocular Movements: Extraocular movements intact.     Conjunctiva/sclera: Conjunctivae normal.     Pupils: Pupils are equal, round, and reactive to light.  Cardiovascular:     Rate and Rhythm: Normal rate.     Heart sounds: No murmur heard. Pulmonary:     Effort: Pulmonary effort is normal.     Breath sounds: Rales present.     Comments: Bibasilar rales.  Abdominal:     General: Bowel sounds are normal.     Palpations: Abdomen is soft.     Tenderness: There is no abdominal tenderness.  Genitourinary:    Comments: Hemorrhoids  Musculoskeletal:        General: Normal range of motion.     Cervical back: Normal range of motion and neck supple.     Right lower leg: No edema.     Left lower leg: No edema.  Skin:    General: Skin is warm and dry.  Neurological:     General: No focal deficit present.     Mental Status: She is alert and oriented to person, place, and time. Mental status is at baseline.     Motor: No weakness.     Gait: Gait normal.  Psychiatric:        Mood and Affect: Mood normal.        Behavior: Behavior normal.        Thought Content: Thought content normal.        Judgment: Judgment normal.     Labs reviewed: Recent Labs    08/19/23 0717 01/27/24 0721   NA 140 139  K 3.6 3.8  CL 101 99  CO2 29 30  GLUCOSE 96 86  BUN 15 14  CREATININE 0.62 0.63  CALCIUM 9.0 9.7   Recent Labs    08/19/23 0717 01/27/24 0721  AST 14 15  ALT 11 12  BILITOT 1.0 0.6  PROT 6.5 6.8   Recent Labs    08/19/23 0717 01/27/24 0721  WBC 7.9 7.1  NEUTROABS 5,720 4,452  HGB 12.5 12.6  HCT 37.6 39.1  MCV 92.8 93.8  PLT 193 212   Lab Results  Component Value Date   TSH 0.24 (L) 01/27/2024   Lab Results  Component Value Date   HGBA1C 6.0 (H) 01/27/2024   Lab Results  Component Value Date   CHOL 240 (H) 01/27/2024   HDL 55 01/27/2024   LDLCALC 158 (H) 01/27/2024   TRIG 144 01/27/2024   CHOLHDL 4.4 01/27/2024    Significant Diagnostic Results in last 30 days:  No results found.  Assessment/Plan  Low TSH level  Low TSH 0.16 08/19/23. 0.24 01/27/24,  declined endocrinology referral.   Prediabetes Hgb A1c 6.0 01/27/24, diet modification.   GERD (gastroesophageal reflux disease) Stable, takes Omeprazole   Hyperlipidemia  declined statin due to intolerance in the past. LDL 158 01/27/24  SVT (supraventricular tachycardia) Heart rate is in control,  takes Metoprolol .   Dizziness Generalized weakness, feeling dizzy when head leaning down, denied chest pain, palpitation, SOB, nausea, vomiting.  01/27/24 labs unremarkable.  Desires to loose Bp control, use soft neck collar for support, delay further workups.    Family/ staff Communication: plan of care reviewed with the patient  Labs/tests ordered: none  F/u in clinic [facility] 3 months.

## 2024-02-26 NOTE — Assessment & Plan Note (Signed)
Heart rate is in control, takes Metoprolol.  

## 2024-02-26 NOTE — Assessment & Plan Note (Signed)
 Stable,  takes Omeprazole

## 2024-02-26 NOTE — Assessment & Plan Note (Signed)
 Generalized weakness, feeling dizzy when head leaning down, denied chest pain, palpitation, SOB, nausea, vomiting. 01/27/24 labs unremarkable.  Desires to loose Bp control, use soft neck collar for support, delay further workups.

## 2024-02-26 NOTE — Assessment & Plan Note (Signed)
 declined statin due to intolerance in the past. LDL 158 01/27/24

## 2024-02-27 ENCOUNTER — Encounter: Payer: Self-pay | Admitting: Nurse Practitioner

## 2024-05-06 ENCOUNTER — Encounter: Payer: Self-pay | Admitting: Nurse Practitioner

## 2024-05-06 ENCOUNTER — Ambulatory Visit: Admitting: Nurse Practitioner

## 2024-05-06 VITALS — BP 122/70 | HR 67 | Temp 98.4°F | Ht 61.0 in | Wt 122.0 lb

## 2024-05-06 DIAGNOSIS — Z0000 Encounter for general adult medical examination without abnormal findings: Secondary | ICD-10-CM | POA: Diagnosis not present

## 2024-05-06 NOTE — Progress Notes (Unsigned)
 "  Chief Complaint  Patient presents with   Medicare Wellness    Annual wellness visit.      Subjective:   Nancy Conrad is a 89 y.o. female who presents for a Welcome to Medicare Exam.   Visit info / Clinical Intake: Medicare Wellness Visit Type:: Subsequent Annual Wellness Visit Persons participating in visit and providing information:: patient Medicare Wellness Visit Mode:: In-person (required for WTM) Interpreter Needed?: No Pre-visit prep was completed: no AWV questionnaire completed by patient prior to visit?: no Living arrangements:: in retirement community Patient's Overall Health Status Rating: very good Typical amount of pain: some Does pain affect daily life?: no Are you currently prescribed opioids?: no  Dietary Habits and Nutritional Risks How many meals a day?: 3 Eats fruit and vegetables daily?: yes Most meals are obtained by: having others provide food; preparing own meals In the last 2 weeks, have you had any of the following?: none Diabetic:: no  Fall Screening Falls in the past year?: 1 Number of falls in past year: 0 Was there an injury with Fall?: 0 Fall Risk Category Calculator: 1 Patient Fall Risk Level: Low Fall Risk  Fall Risk Patient at Risk for Falls Due to: No Fall Risks Fall risk Follow up: Falls evaluation completed  Cognitive Assessment Difficulty concentrating, remembering, or making decisions? : no Will 6CIT or Mini Cog be Completed: yes What year is it?: 0 points What month is it?: 0 points Give patient an address phrase to remember (5 components): [address] [place] About what time is it?: 0 points Count backwards from 20 to 1: 0 points Say the months of the year in reverse: 0 points Repeat the address phrase from earlier: 0 points 6 CIT Score: 0 points  Advance Directives (For Healthcare) Does Patient Have a Medical Advance Directive?: Yes Does patient want to make changes to medical advance directive?: No -  Patient declined Type of Advance Directive: Living will; Out of facility DNR (pink MOST or yellow form) Copy of Living Will in Chart?: Yes - validated most recent copy scanned in chart (See row information) Out of facility DNR (pink MOST or yellow form) in Chart? (Ambulatory ONLY): Yes - validated most recent copy scanned in chart Pre-existing out of facility DNR order (yellow form or pink MOST form): Pink MOST form placed in chart (order not valid for inpatient use)    Allergies (verified) Codeine   Current Medications (verified) Outpatient Encounter Medications as of 05/06/2024  Medication Sig   acetaminophen  (TYLENOL ) 500 MG tablet Take 1,000 mg by mouth every 6 (six) hours as needed for mild pain.    B Complex Vitamins (VITAMIN B COMPLEX PO) Take 1 tablet by mouth every Monday, Wednesday, and Friday.    Cholecalciferol (VITAMIN D ) 50 MCG (2000 UT) tablet Take 2,000 Units by mouth daily.    ibuprofen (ADVIL) 200 MG tablet Take 200 mg by mouth as needed.   Lutein-Zeaxanthin 25-5 MG CAPS Take 1 capsule by mouth daily.   metoprolol  tartrate (LOPRESSOR ) 25 MG tablet Take 25 mg by mouth as directed. 1 tablet in the morning and 1/2 tablet in the evening   omeprazole  (PRILOSEC) 20 MG capsule Take 1 capsule (20 mg total) by mouth daily.   metoprolol  tartrate (LOPRESSOR ) 25 MG tablet Take 0.5 tablets (12.5 mg total) by mouth 2 (two) times daily with a meal. (Patient not taking: Reported on 05/06/2024)   No facility-administered encounter medications on file as of 05/06/2024.    History:  Past Medical History:  Diagnosis Date   Arthritis    Dyspnea    with exertion   History of kidney stones    1962   SVT (supraventricular tachycardia)    Past Surgical History:  Procedure Laterality Date   TONSILLECTOMY     TOTAL HIP ARTHROPLASTY Left 03/15/2020   Procedure: TOTAL HIP ARTHROPLASTY ANTERIOR APPROACH;  Surgeon: Melodi Lerner, MD;  Location: [facility];  Service: Orthopedics;  Laterality:  Left;    VAGINAL DELIVERY     x4   Family History  Problem Relation Age of Onset   CVA Mother    Heart attack Father    Social History   Occupational History   Not on file  Tobacco Use   Smoking status: Never   Smokeless tobacco: Never  Vaping Use   Vaping status: Never Used  Substance and Sexual Activity   Alcohol use: Yes    Alcohol/week: 1.0 standard drink of alcohol    Types: 1 Glasses of wine per week    Comment: socially   Drug use: No   Sexual activity: Not on file   Tobacco Counseling Counseling given: Not Answered  SDOH Screenings   Depression (PHQ2-9): Low Risk (05/06/2024)  Tobacco Use: Low Risk (05/06/2024)   See flowsheets for full screening details  Depression Screen PHQ 2 & 9 Depression Scale- Over the past 2 weeks, how often have you been bothered by any of the following problems? Little interest or pleasure in doing things: 0 Feeling down, depressed, or hopeless (PHQ Adolescent also includes...irritable): 0 PHQ-2 Total Score: 0      Goals Addressed   None          Objective:    Today's Vitals   05/06/24 1445  BP: 122/70  Pulse: 67  Temp: 98.4 F (36.9 C)  SpO2: 94%  Weight: 122 lb (55.3 kg)  Height: 5' 1 (1.549 m)   Body mass index is 23.05 kg/m.   Physical Exam{(optional), or other factors deemed appropriate based on the beneficiary's medical and social history and current clinical standards. - TEXT BETWEEN BRACKETS WILL DISAPPEAR WHEN YOU SIGN THE ENCOUNTER:1}    Hearing/Vision screen Hearing Screening - Comments:: No hearing issues  Vision Screening - Comments:: Last eye exam less than 12 months ago, [facility] , Dr.Lyles Immunizations and Health Maintenance Health Maintenance  Topic Date Due   Medicare Annual Wellness (AWV)  06/20/2023   Zoster Vaccines- Shingrix (2 of 2) 05/28/2024 (Originally 10/30/2020)   COVID-19 Vaccine (9 - Moderna risk 2025-26 season) 07/19/2024   DTaP/Tdap/Td (2 - Tdap)  06/05/2027   Pneumococcal Vaccine: 50+ Years  Completed   Influenza Vaccine  Completed   Bone Density Scan  Completed   Meningococcal B Vaccine  Aged Out    EKG: {ekg findings:315101}     Assessment/Plan:  This is a routine wellness examination for Nancy Conrad.  Patient Care Team: Taiven Greenley X, NP as PCP - General (Internal Medicine) Charmayne Molly, MD as Consulting Physician (Ophthalmology)  I have personally reviewed and noted the following in the patients chart:   Medical and social history Use of alcohol, tobacco or illicit drugs  Current medications and supplements including opioid prescriptions. Functional ability and status Nutritional status Physical activity Advanced directives List of other physicians Hospitalizations, surgeries, and ER visits in previous 12 months Vitals Screenings to include cognitive, depression, and falls Referrals and appointments  No orders of the defined types were placed in this encounter.  In addition, I have  reviewed and discussed with patient certain preventive protocols, quality metrics, and best practice recommendations. A written personalized care plan for preventive services as well as general preventive health recommendations were provided to patient.   Markham Dumlao X Breeley Bischof, NP   05/06/2024   No follow-ups on file.  "

## 2024-05-27 ENCOUNTER — Encounter: Admitting: Nurse Practitioner

## 2024-06-24 ENCOUNTER — Encounter: Admitting: Nurse Practitioner

## 2024-07-22 ENCOUNTER — Encounter: Payer: Self-pay | Admitting: Nurse Practitioner

## 2025-05-19 ENCOUNTER — Ambulatory Visit: Admitting: Nurse Practitioner
